# Patient Record
Sex: Male | Born: 1995 | Race: White | Hispanic: No | Marital: Single | Smoking: Never smoker
Health system: Southern US, Community
[De-identification: ages and names within clinical notes are randomized; demographics above are authoritative.]

## PROBLEM LIST (undated history)

## (undated) DIAGNOSIS — R42 Dizziness and giddiness: Secondary | ICD-10-CM

## (undated) DIAGNOSIS — M109 Gout, unspecified: Secondary | ICD-10-CM

---

## 2021-01-17 DIAGNOSIS — S60132A Contusion of left middle finger with damage to nail, initial encounter: Secondary | ICD-10-CM | POA: Diagnosis not present

## 2021-01-17 DIAGNOSIS — W230XXA Caught, crushed, jammed, or pinched between moving objects, initial encounter: Secondary | ICD-10-CM | POA: Insufficient documentation

## 2021-01-17 DIAGNOSIS — S6992XA Unspecified injury of left wrist, hand and finger(s), initial encounter: Secondary | ICD-10-CM | POA: Diagnosis present

## 2021-01-18 ENCOUNTER — Other Ambulatory Visit: Payer: Self-pay

## 2021-01-18 ENCOUNTER — Emergency Department (HOSPITAL_COMMUNITY)
Admission: EM | Admit: 2021-01-18 | Discharge: 2021-01-18 | Disposition: A | Discharge status: Home / Self Care | Payer: Worker's Compensation | Attending: Emergency Medicine | Admitting: Emergency Medicine

## 2021-01-18 ENCOUNTER — Emergency Department (HOSPITAL_COMMUNITY): Payer: Worker's Compensation

## 2021-01-18 ENCOUNTER — Encounter (HOSPITAL_COMMUNITY): Payer: Self-pay | Admitting: Emergency Medicine

## 2021-01-18 DIAGNOSIS — S6010XA Contusion of unspecified finger with damage to nail, initial encounter: Secondary | ICD-10-CM

## 2021-01-18 MED ORDER — LIDOCAINE HCL (PF) 1 % IJ SOLN
30.0000 mL | Freq: Once | INTRAMUSCULAR | Status: AC
  Administered 2021-01-18: 30 mL
  Filled 2021-01-18: qty 30

## 2021-01-18 NOTE — ED Provider Notes (Signed)
AP-EMERGENCY DEPT [facility] Emergency Department Provider Note MRN:  [id number]  Arrival date & time: 01/18/21     Chief Complaint   Hand Pain   History of Present Illness   Kevin Willis is a 25 y.o. year-old male with no part of past medical history presenting to the ED with chief complaint of hand pain.  Patient slammed finger into a door about 24 hours ago.  Having worsening pain and discoloration of the left middle finger nail and tip of the finger.  Denies any other injuries or complaints.  Pain is moderate to severe, constant.  Review of Systems  A complete 10 system review of systems was obtained and all systems are negative except as noted in the HPI and PMH.   Patient's Health History   History reviewed. No pertinent past medical history.  History reviewed. No pertinent surgical history.  No family history on file.  Social History   Socioeconomic History  . Marital status: Single    Spouse name: Not on file  . Number of children: Not on file  . Years of education: Not on file  . Highest education level: Not on file  Occupational History  . Not on file  Tobacco Use  . Smoking status: Never Smoker  . Smokeless tobacco: Never Used  Substance and Sexual Activity  . Alcohol use: Yes    Comment: occ  . Drug use: Never  . Sexual activity: Not on file  Other Topics Concern  . Not on file  Social History Narrative  . Not on file   Social Determinants of Health   Financial Resource Strain: Not on file  Food Insecurity: Not on file  Transportation Needs: Not on file  Physical Activity: Not on file  Stress: Not on file  Social Connections: Not on file  Intimate Partner Violence: Not on file     Physical Exam   Vitals:   01/18/21 0156  BP: (!) 143/97  Pulse: 88  Resp: 20  Temp: 98.3 F (36.8 C)  SpO2: 97%    CONSTITUTIONAL: Well-appearing, NAD NEURO:  Alert and oriented x 3, no focal deficits EYES:  eyes equal and reactive ENT/NECK:  no LAD,  no JVD CARDIO: Regular rate, well-perfused, normal S1 and S2 PULM:  CTAB no wheezing or rhonchi GI/GU:  normal bowel sounds, non-distended, non-tender MSK/SPINE:  No gross deformities, no edema SKIN: Subungual hematoma to the left middle fingernail, normal distal cap refill, tender to palpation PSYCH:  Appropriate speech and behavior  *Additional and/or pertinent findings included in MDM below  Diagnostic and Interventional Summary    EKG Interpretation  Date/Time:    Ventricular Rate:    PR Interval:    QRS Duration:   QT Interval:    QTC Calculation:   R Axis:     Text Interpretation:        Labs Reviewed - No data to display  DG Finger Middle Left  Final Result      Medications  lidocaine (PF) (XYLOCAINE) 1 % injection 30 mL (30 mLs Infiltration Given 01/18/21 0451)     Procedures  /  Critical Care .Nerve Block  Date/Time: 01/18/2021 5:11 AM Performed by: Sabas Sous, MD Authorized by: Sabas Sous, MD   Consent:    Consent obtained:  Verbal   Consent given by:  Patient   Risks, benefits, and alternatives were discussed: yes     Risks discussed:  Allergic reaction, bleeding, infection, intravenous injection, nerve damage, pain, swelling and  unsuccessful block Universal protocol:    Procedure explained and questions answered to patient or proxy's satisfaction: yes     Patient identity confirmed:  Verbally with patient Indications:    Indications:  Procedural anesthesia Location:    Nerve block body site: Finger block.   Laterality:  Left (Middle finger) Pre-procedure details:    Skin preparation:  Alcohol   Preparation: Patient was prepped and draped in usual sterile fashion   Procedure details:    Block needle gauge:  25 G   Anesthetic injected:  Lidocaine 1% w/o epi   Paresthesia:  None Post-procedure details:    Dressing:  None   Outcome:  Anesthesia achieved   Procedure completion:  Tolerated well, no immediate complications Aspiration of  blood/fluid  Date/Time: 01/18/2021 5:12 AM Performed by: Sabas Sous, MD Authorized by: Sabas Sous, MD  Consent: Verbal consent obtained. Risks and benefits: risks, benefits and alternatives were discussed Consent given by: patient Patient understanding: patient states understanding of the procedure being performed Preparation: Patient was prepped and draped in the usual sterile fashion. Patient tolerance: patient tolerated the procedure well with no immediate complications Comments: Cautery pen used to perform trephination of left middle finger subungual hematoma.  Successful with expression of subungual blood.     ED Course and Medical Decision Making  I have reviewed the triage vital signs, the nursing notes, and pertinent available records from the EMR.  Listed above are laboratory and imaging tests that I personally ordered, reviewed, and interpreted and then considered in my medical decision making (see below for details).  X-rays without fracture, pain is worsening and there is a large subungual hematoma evident on exam.  We discussed management options and decision was made to perform trephination procedure, which worked well and a fair amount of dark blood was expressed from under the nail.  Pain is improved, appropriate for discharge.       Elmer Sow. Pilar Plate, MD [facility] Emergency Medicine [facility] [email or web address]  Final Clinical Impressions(s) / ED Diagnoses     ICD-10-CM   1. Subungual hematoma of digit of hand, initial encounter  S60.10XA     ED Discharge Orders    None       Discharge Instructions Discussed with and Provided to Patient:     Discharge Instructions     You were evaluated in the Emergency Department and after careful evaluation, we did not find any emergent condition requiring admission or further testing in the hospital.  Your exam/testing today was overall reassuring.  X-ray did not show any broken bones.   Your pain seem to be due to a collection of blood underneath the nail, which we were able to drain here in the emergency department.  This should relieve the pressure and the pain.  There may be some drainage over the next few hours, this is normal.  Recommend Tylenol or Motrin at home for discomfort  Please return to the Emergency Department if you experience any worsening of your condition.  Thank you for allowing [place] to be a part of your care.        Sabas Sous, MD 01/18/21 [phone]

## 2021-01-18 NOTE — ED Notes (Signed)
Pt finger wrapped and tapped with gauze

## 2021-01-18 NOTE — Discharge Instructions (Addendum)
You were evaluated in the Emergency Department and after careful evaluation, we did not find any emergent condition requiring admission or further testing in the hospital.  Your exam/testing today was overall reassuring.  X-ray did not show any broken bones.  Your pain seem to be due to a collection of blood underneath the nail, which we were able to drain here in the emergency department.  This should relieve the pressure and the pain.  There may be some drainage over the next few hours, this is normal.  Recommend Tylenol or Motrin at home for discomfort  Please return to the Emergency Department if you experience any worsening of your condition.  Thank you for allowing [place] to be a part of your care.

## 2021-01-18 NOTE — ED Triage Notes (Signed)
Pt c/o left middle finger pain after slamming his finger in the door.

## 2021-01-28 ENCOUNTER — Ambulatory Visit (HOSPITAL_COMMUNITY)
Admission: RE | Admit: 2021-01-28 | Discharge: 2021-01-28 | Disposition: A | Discharge status: Home / Self Care | Payer: BC Managed Care – PPO | Source: Ambulatory Visit | Attending: Emergency Medicine | Admitting: Emergency Medicine

## 2021-01-28 ENCOUNTER — Encounter (HOSPITAL_COMMUNITY): Payer: Self-pay

## 2021-01-28 ENCOUNTER — Ambulatory Visit (HOSPITAL_COMMUNITY): Payer: Self-pay

## 2021-01-28 ENCOUNTER — Other Ambulatory Visit: Payer: Self-pay

## 2021-01-28 VITALS — BP 126/91 | HR 88 | Temp 97.5°F | Resp 20

## 2021-01-28 DIAGNOSIS — M654 Radial styloid tenosynovitis [de Quervain]: Secondary | ICD-10-CM | POA: Diagnosis not present

## 2021-01-28 DIAGNOSIS — M109 Gout, unspecified: Secondary | ICD-10-CM

## 2021-01-28 HISTORY — DX: Gout, unspecified: M10.9

## 2021-01-28 HISTORY — DX: Dizziness and giddiness: R42

## 2021-01-28 MED ORDER — IBUPROFEN 600 MG PO TABS: 600.0000 mg | ORAL_TABLET | Freq: Four times a day (QID) | ORAL | 0 refills | Status: DC | PRN

## 2021-01-28 MED ORDER — COLCHICINE 0.6 MG PO TABS: ORAL_TABLET | ORAL | 0 refills | Status: DC

## 2021-01-28 NOTE — ED Provider Notes (Signed)
HPI  SUBJECTIVE:  Kevin Willis is a 25 y.o. male who presents with 2 issues.  First he is requesting a refill on his colchicine.  States that he gets gout usually in the left foot.  Second, he is a right-handed male who reports atraumatic wrist pain starting last night while lifting a glass.  He states is located along his entire wrist.  Describes it as tight, stabbing.  Reports hypersensitivity.  No erythema, swelling, paresthesias, trauma to the wrist, change in his physical activity, numbness or tingling or limitation of motion of his fingers.  He states that the symptoms are getting better today but wanted to have it evaluated.  He tried ice with improvement in his symptoms.  Symptoms are worse with flexion/extension, radial deviation and picking objects up.  He states this does not feel like gout.  He has a past medical history of sprain left wrist.  No wrist fracture.  He has a history of gout vertigo.  No history of chronic kidney disease, peptic ulcer disease, GI bleed, carpal tunnel syndrome, diabetes, hypertension.  PMD: None.  He just moved here.  Past Medical History:  Diagnosis Date  . Gout   . Vertigo     History reviewed. No pertinent surgical history.  Family History  Family history unknown: Yes    Social History   Tobacco Use  . Smoking status: Never Smoker  . Smokeless tobacco: Never Used  Substance Use Topics  . Alcohol use: Yes    Comment: occ  . Drug use: Never    No current facility-administered medications for this encounter.  Current Outpatient Medications:  .  colchicine 0.6 MG tablet, 2 tabs po x 1, then one tab po 1 hour later, Disp: 6 tablet, Rfl: 0 .  ibuprofen (ADVIL) 600 MG tablet, Take 1 tablet (600 mg total) by mouth every 6 (six) hours as needed., Disp: 30 tablet, Rfl: 0  Allergies  Allergen Reactions  . Shellfish Allergy      ROS  As noted in HPI.   Physical Exam  BP (!) 126/91 (BP Location: Left Arm)   Pulse 88   Temp (!) 97.5 F  (36.4 C) (Oral)   Resp 20   SpO2 98%   Constitutional: Well developed, well nourished, no acute distress Eyes:  EOMI, conjunctiva normal bilaterally HENT: Normocephalic, atraumatic,mucus membranes moist Respiratory: Normal inspiratory effort Cardiovascular: Normal rate GI: nondistended skin: No rash, skin intact Musculoskeletal:  L distal radial styloid tenderness.  Distal radius NT, distal ulnar styloid NT, snuffbox NT, carpals NT , metacarpals NT , digits NT , TFCC NT.   no pain with supination,   no pain with pronation, pain with radial deviation.  No pain with ulnar deviation.  Motor intact ability to flex / extend digits of affected hand, Sensation LT to hand normal, RP 2+.   Finklestein Positive , Elbow and proximal forearm NT Neurologic: Alert & oriented x 3, no focal neuro deficits Psychiatric: Speech and behavior appropriate   ED Course   Medications - No data to display  Orders Placed This Encounter  Procedures  . Thumb spica    Standing Status:   Standing    Number of Occurrences:   1    Order Specific Question:   Laterality    Answer:   Left    No results found for this or any previous visit (from the past 24 hour(s)). No results found.  ED Clinical Impression  1. Tenosynovitis, de Quervain   2. Acute  gout of left foot, unspecified cause      ED Assessment/Plan  1.  Left wrist pain.  Appears to be de Quervain's tenosynovitis.  Will place in thumb spica, Tylenol/ibuprofen.  Follow-up with ortho hand if not better in 2 to 3 weeks with conservative therapy.  Dr. Roney Mans on call.  Deferred imaging in the absence of trauma.  Work note for today.  2.  Medication refill for gout.  Refilling colchicine  Discussed  MDM, treatment plan, and plan for follow-up with patient. . patient agrees with plan.   Meds ordered this encounter  Medications  . colchicine 0.6 MG tablet    Sig: 2 tabs po x 1, then one tab po 1 hour later    Dispense:  6 tablet    Refill:  0   . ibuprofen (ADVIL) 600 MG tablet    Sig: Take 1 tablet (600 mg total) by mouth every 6 (six) hours as needed.    Dispense:  30 tablet    Refill:  0    *This clinic note was created using Scientist, clinical (histocompatibility and immunogenetics). Therefore, there may be occasional mistakes despite careful proofreading.   ?    Domenick Gong, MD 01/28/21 [phone]

## 2021-01-28 NOTE — Discharge Instructions (Addendum)
Take 600 mg of ibuprofen combined with 1000 mg of Tylenol together 3 or 4 times a day as needed for pain.  Wear the wrist brace at all times for the first week, then as needed for comfort.  You may ice your wrist.  Below is a list of primary care practices who are taking new patients for you to follow-up with.  [facility] internal medicine clinic Ground Floor - [facility], [address] [place], [place], [place] [address] [phone]  [facility] Primary Care at [facility] [address] [place], [place] [address] [phone]  [facility] [address] [place], [place] [address] [phone]  [facility] [phone] [address] [place] [address]  [facility]: [address] [place] [address]  [phone]  [facility]: [address] [place] [address]   [phone]  [facility]: [address] [place] [address]  [phone]  [facility] : [address] [place] [address] [phone]  [facility] Primary Care: [address] [place] [address] [phone]  [facility]: [address] [place] [address] [phone]  Dr. Oneal Grout [address] [facility] [place] [address]  [phone]  Dr. Jackie Plum, [facility]. [address] [place], [place] [address]  [phone]  Go to www.goodrx.com to look up your medications. This will give you a list of where you can find your prescriptions at the most affordable prices. Or ask the pharmacist what the cash price is, or if they have any other discount programs available to help make your medication more affordable. This can be less expensive than what you  would pay with insurance.

## 2021-01-28 NOTE — ED Triage Notes (Signed)
Pt presents for medication refill (gout) and left wrist pain non injury related since yesterday.

## 2021-05-23 ENCOUNTER — Other Ambulatory Visit: Payer: Self-pay

## 2021-05-23 ENCOUNTER — Ambulatory Visit
Admission: EM | Admit: 2021-05-23 | Discharge: 2021-05-23 | Disposition: A | Discharge status: Home / Self Care | Payer: BC Managed Care – PPO | Attending: Family Medicine | Admitting: Family Medicine

## 2021-05-23 DIAGNOSIS — M79672 Pain in left foot: Secondary | ICD-10-CM

## 2021-05-23 MED ORDER — PREDNISONE 20 MG PO TABS: 40.0000 mg | ORAL_TABLET | Freq: Every day | ORAL | 0 refills | Status: DC

## 2021-05-23 MED ORDER — CYCLOBENZAPRINE HCL 10 MG PO TABS: 10.0000 mg | ORAL_TABLET | Freq: Three times a day (TID) | ORAL | 0 refills | Status: DC | PRN

## 2021-05-23 NOTE — ED Triage Notes (Signed)
Two weeks h/o pain in dorsal and plantar surface if his left foot that radiates to his anterior shin. Pt characterizes his pain as "with each step it feels like a nail is shooting through". Currently wearing compression socks and a brace with minimal relief. Also taking Ibuprofen with temporary relief. No falls or injuries. Denies right LE pain. Has a h/o gout.

## 2021-05-23 NOTE — ED Provider Notes (Signed)
[facility]    CSN: [id number] Arrival date & time: 05/23/21  0815      History   Chief Complaint Chief Complaint  Patient presents with   Foot Pain    left    HPI Kevin Willis is a 25 y.o. male.   Patient presenting today with about 2-week history of left foot pain, worse with movement and weightbearing.  States the pain sometimes radiates up the back of the ankle and the anterior shin.  Intermittent swelling, darkness to the skin but no skin lesions, fever, drainage, numbness, tingling, weakness and no known injury prior to onset of symptoms.  So far trying Dr. Margart Sickles inserts to his steel toe work boots with minimal benefit.  Ibuprofen resolves pain temporarily.  No past history of known chronic foot issues.   Past Medical History:  Diagnosis Date   Gout    Vertigo     There are no problems to display for this patient.   History reviewed. No pertinent surgical history.     Home Medications    Prior to Admission medications   Medication Sig Start Date End Date Taking? Authorizing Provider  cyclobenzaprine (FLEXERIL) 10 MG tablet Take 1 tablet (10 mg total) by mouth 3 (three) times daily as needed for muscle spasms. Do not drink alcohol or drive while taking this medication 05/23/21  Yes Particia Nearing, PA-C  predniSONE (DELTASONE) 20 MG tablet Take 2 tablets (40 mg total) by mouth daily with breakfast. 05/23/21  Yes Particia Nearing, PA-C  colchicine 0.6 MG tablet 2 tabs po x 1, then one tab po 1 hour later 01/28/21   Domenick Gong, MD  ibuprofen (ADVIL) 600 MG tablet Take 1 tablet (600 mg total) by mouth every 6 (six) hours as needed. 01/28/21   Domenick Gong, MD    Family History Family History  Family history unknown: Yes    Social History Social History   Tobacco Use   Smoking status: Never   Smokeless tobacco: Never  Substance Use Topics   Alcohol use: Yes    Comment: occ   Drug use: Never     Allergies   Shellfish  allergy   Review of Systems Review of Systems Per HPI  Physical Exam Triage Vital Signs ED Triage Vitals  Enc Vitals Group     BP 05/23/21 0902 (!) 149/83     Pulse Rate 05/23/21 0902 84     Resp 05/23/21 0902 18     Temp 05/23/21 0902 97.9 F (36.6 C)     Temp Source 05/23/21 0902 Oral     SpO2 05/23/21 0902 95 %     Weight --      Height --      Head Circumference --      Peak Flow --      Pain Score 05/23/21 0906 4     Pain Loc --      Pain Edu? --      Excl. in GC? --    No data found.  Updated Vital Signs BP (!) 149/83 (BP Location: Left Arm)   Pulse 84   Temp 97.9 F (36.6 C) (Oral)   Resp 18   SpO2 95%   Visual Acuity Right Eye Distance:   Left Eye Distance:   Bilateral Distance:    Right Eye Near:   Left Eye Near:    Bilateral Near:     Physical Exam Vitals and nursing note reviewed.  Constitutional:  Appearance: Normal appearance.  HENT:     Head: Atraumatic.  Eyes:     Extraocular Movements: Extraocular movements intact.     Conjunctiva/sclera: Conjunctivae normal.  Cardiovascular:     Rate and Rhythm: Normal rate and regular rhythm.     Pulses: Normal pulses.  Pulmonary:     Effort: Pulmonary effort is normal.     Breath sounds: Normal breath sounds.  Musculoskeletal:        General: Tenderness present. No swelling, deformity or signs of injury. Normal range of motion.     Cervical back: Normal range of motion and neck supple.     Comments: No obvious focal swelling left foot or ankle Diffuse tenderness to palpation bilateral Achilles, left lateral foot into ankle anteriorly.  Range of motion full and intact all 5 toes and left ankle.  Strength intact.  Normal gait.  Skin:    General: Skin is warm and dry.     Findings: No bruising or erythema.  Neurological:     General: No focal deficit present.     Mental Status: He is oriented to person, place, and time.     Comments: Left lower extremity neurovascularly intact  Psychiatric:         Mood and Affect: Mood normal.        Thought Content: Thought content normal.        Judgment: Judgment normal.   UC Treatments / Results  Labs (all labs ordered are listed, but only abnormal results are displayed) Labs Reviewed - No data to display  EKG   Radiology No results found.  Procedures Procedures (including critical care time)  Medications Ordered in UC Medications - No data to display  Initial Impression / Assessment and Plan / UC Course  I have reviewed the triage vital signs and the nursing notes.  Pertinent labs & imaging results that were available during my care of the patient were reviewed by me and considered in my medical decision making (see chart for details).     Suspect a tendinitis/overuse injury.  No evidence of a bony injury at this point, no deformity on palpation and no traumatic injury.  We will treat for inflammatory cause with prednisone, Flexeril, gentle stretches, rest, ice.  Work note given.  Follow-up with sports medicine if worsening or not resolving.  Final Clinical Impressions(s) / UC Diagnoses   Final diagnoses:  Left foot pain   Discharge Instructions   None    ED Prescriptions     Medication Sig Dispense Auth. Provider   predniSONE (DELTASONE) 20 MG tablet Take 2 tablets (40 mg total) by mouth daily with breakfast. 10 tablet Particia Nearing, PA-C   cyclobenzaprine (FLEXERIL) 10 MG tablet Take 1 tablet (10 mg total) by mouth 3 (three) times daily as needed for muscle spasms. Do not drink alcohol or drive while taking this medication 15 tablet Particia Nearing, [place]      PDMP not reviewed this encounter.   Kevin Willis [place], [place] 05/23/21 [phone]

## 2021-07-15 ENCOUNTER — Other Ambulatory Visit: Payer: Self-pay

## 2021-07-15 ENCOUNTER — Ambulatory Visit
Admission: RE | Admit: 2021-07-15 | Discharge: 2021-07-15 | Disposition: A | Discharge status: Home / Self Care | Payer: BC Managed Care – PPO | Source: Ambulatory Visit | Attending: Emergency Medicine | Admitting: Emergency Medicine

## 2021-07-15 VITALS — BP 118/84 | HR 103 | Temp 99.2°F | Resp 17

## 2021-07-15 DIAGNOSIS — M7541 Impingement syndrome of right shoulder: Secondary | ICD-10-CM | POA: Diagnosis not present

## 2021-07-15 DIAGNOSIS — M25511 Pain in right shoulder: Secondary | ICD-10-CM

## 2021-07-15 MED ORDER — NAPROXEN 500 MG PO TABS: 500.0000 mg | ORAL_TABLET | Freq: Two times a day (BID) | ORAL | 0 refills | Status: DC

## 2021-07-15 MED ORDER — TIZANIDINE HCL 2 MG PO TABS: 2.0000 mg | ORAL_TABLET | Freq: Four times a day (QID) | ORAL | 0 refills | Status: DC | PRN
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
## 2021-07-15 NOTE — Discharge Instructions (Addendum)
Wear sling only for comfort, gentle range of motion exercises for shoulder-see attached Naprosyn twice daily with food for pain Supplement with tizanidine at home/bedtime, this is a muscle laxer, may cause drowsiness, do not drive or work after taking Follow-up with sports medicine for persistent symptoms

## 2021-07-15 NOTE — ED Triage Notes (Signed)
Pt presents with shoulder pain x 4 weeks. States he feel discomfort when lifting it up.

## 2021-09-10 ENCOUNTER — Ambulatory Visit (INDEPENDENT_AMBULATORY_CARE_PROVIDER_SITE_OTHER): Payer: BC Managed Care – PPO

## 2021-09-10 ENCOUNTER — Other Ambulatory Visit: Payer: Self-pay

## 2021-09-10 ENCOUNTER — Ambulatory Visit
Admission: RE | Admit: 2021-09-10 | Discharge: 2021-09-10 | Disposition: A | Discharge status: Home / Self Care | Payer: BC Managed Care – PPO | Source: Ambulatory Visit | Attending: Urgent Care | Admitting: Urgent Care

## 2021-09-10 VITALS — BP 137/87 | HR 95 | Temp 98.1°F | Resp 16

## 2021-09-10 DIAGNOSIS — M25462 Effusion, left knee: Secondary | ICD-10-CM

## 2021-09-10 DIAGNOSIS — M25562 Pain in left knee: Secondary | ICD-10-CM | POA: Diagnosis not present

## 2021-09-10 MED ORDER — NAPROXEN 500 MG PO TABS: 500.0000 mg | ORAL_TABLET | Freq: Two times a day (BID) | ORAL | 0 refills | Status: DC

## 2021-09-10 NOTE — ED Triage Notes (Addendum)
Left knee pain starting last week while at work. States he sat down and felt a crackling sensation with mild pain in knee, ignored it. Over time the pain and steadily increased, now posterior and "feels like it's pulling down the back of my calf." Reports in 2021 fell 5 ft down stairs at work, urgent care at that time told him he had messed his knee up. Ht: 6'1"  Wt: 303 lb

## 2021-09-10 NOTE — ED Provider Notes (Signed)
[facility]   MRN: [id number] DOB: 11-16-1996  Subjective:   Kevin Willis is a 25 y.o. male presenting for 1 week history of persistent left knee pain.  Patient states that he was at work at the time, was sitting down when he tried to bend his leg and felt a loud pop.  States that since then it has become progressively worse including difficulty with bending at the level of his knee.  He is also having to walk with a limp.  Has a history of gout but reports that this is different.  No fall, trauma recently.  However he did fall and 2021 causing a left knee injury.  No current facility-administered medications for this encounter.  Current Outpatient Medications:  .  naproxen (NAPROSYN) 500 MG tablet, Take 1 tablet (500 mg total) by mouth 2 (two) times daily., Disp: 30 tablet, Rfl: 0 .  tiZANidine (ZANAFLEX) 2 MG tablet, Take 1-2 tablets (2-4 mg total) by mouth every 6 (six) hours as needed for muscle spasms., Disp: 30 tablet, Rfl: 0   Allergies  Allergen Reactions  . Shellfish Allergy     Past Medical History:  Diagnosis Date  . Gout   . Vertigo      History reviewed. No pertinent surgical history.  Family History  Family history unknown: Yes    Social History   Tobacco Use  . Smoking status: Never  . Smokeless tobacco: Never  Substance Use Topics  . Alcohol use: Yes    Comment: occ  . Drug use: Never    ROS   Objective:   Vitals: BP (!) 160/112 (BP Location: Right Arm)   Pulse 95   Temp 98.1 F (36.7 C) (Oral)   Resp 16   SpO2 95%   BP recheck was 137/87.   Physical Exam Constitutional:      General: He is not in acute distress.    Appearance: Normal appearance. He is well-developed and normal weight. He is not ill-appearing, toxic-appearing or diaphoretic.  HENT:     Head: Normocephalic and atraumatic.     Right Ear: External ear normal.     Left Ear: External ear normal.     Nose: Nose normal.     Mouth/Throat:     Pharynx:  Oropharynx is clear.  Eyes:     General: No scleral icterus.       Right eye: No discharge.        Left eye: No discharge.     Extraocular Movements: Extraocular movements intact.     Pupils: Pupils are equal, round, and reactive to light.  Cardiovascular:     Rate and Rhythm: Normal rate.  Pulmonary:     Effort: Pulmonary effort is normal.  Musculoskeletal:     Cervical back: Normal range of motion.     Left knee: Effusion and bony tenderness present. No swelling, deformity, erythema, ecchymosis, lacerations or crepitus. Decreased range of motion. Tenderness present over the medial joint line, lateral joint line and patellar tendon. Normal alignment and normal patellar mobility.  Skin:    General: Skin is warm and dry.  Neurological:     Mental Status: He is alert and oriented to person, place, and time.  Psychiatric:        Mood and Affect: Mood normal.        Behavior: Behavior normal.        Thought Content: Thought content normal.        Judgment: Judgment normal.  DG Knee Complete 4 Views Left  Result Date: 09/10/2021 CLINICAL DATA:  Left knee pain for 1 week after sitting down with crackling sensation EXAM: LEFT KNEE - COMPLETE 4+ VIEW COMPARISON:  None. FINDINGS: Probable small suprapatellar left knee joint effusion. No fracture or dislocation. No focal osseous lesions. No significant degenerative arthropathy. No radiopaque foreign bodies. IMPRESSION: Probable small suprapatellar left knee joint effusion. No fracture or malalignment. Electronically Signed   By: Delbert Phenix M.D.   On: 09/10/2021 14:06     Assessment and Plan :   PDMP not reviewed this encounter.  1. Acute pain of left knee   2. Effusion of left knee     Low suspicion for gout of the knee given physical exam findings.  Recommended conservative management with RICE method, naproxen for pain and inflammation.  Also advised that he pursue follow-up with an orthopedist for consideration of an MRI or  ultrasound to further outline whether or not there is a ligamentous or meniscus problem. Counseled patient on potential for adverse effects with medications prescribed/recommended today, ER and return-to-clinic precautions discussed, patient verbalized understanding.    Wallis Bamberg, [place] 09/10/21 1436

## 2021-11-22 ENCOUNTER — Ambulatory Visit
Admission: RE | Admit: 2021-11-22 | Discharge status: Against Medical Advice | Payer: BC Managed Care – PPO | Source: Ambulatory Visit

## 2021-11-24 ENCOUNTER — Other Ambulatory Visit: Payer: Self-pay

## 2021-11-24 ENCOUNTER — Ambulatory Visit
Admission: RE | Admit: 2021-11-24 | Discharge: 2021-11-24 | Disposition: A | Discharge status: Home / Self Care | Payer: BC Managed Care – PPO | Source: Ambulatory Visit

## 2021-11-24 VITALS — BP 116/77 | HR 82 | Temp 98.0°F | Resp 18

## 2021-11-24 DIAGNOSIS — M5442 Lumbago with sciatica, left side: Secondary | ICD-10-CM

## 2021-11-24 MED ORDER — PREDNISONE 20 MG PO TABS: 40.0000 mg | ORAL_TABLET | Freq: Every day | ORAL | 0 refills | Status: AC

## 2021-11-24 MED ORDER — CYCLOBENZAPRINE HCL 10 MG PO TABS: 10.0000 mg | ORAL_TABLET | Freq: Two times a day (BID) | ORAL | 0 refills | Status: DC | PRN

## 2021-11-24 NOTE — ED Provider Notes (Signed)
Kevin Willis    CSN: [id number] Arrival date & time: 11/24/21  0850      History   Chief Complaint Chief Complaint  Patient presents with   Back Pain    lumbar   9a appointment    HPI Kevin Willis is a 25 y.o. male.   Patient here today for evaluation of low back pain that is been intermittent over the last 2 years since an accident at work.  He reports that over the last week he has had worsening low back pain that will radiate down his left buttocks and into his left thigh.  Pain stops at the knee.  He reports the pain is stabbing in nature.  He does report some issues with pain in his leg creating difficulty walking at times.  He does not report any numbness or tingling.  Previously he was on [employer]. for symptoms however they recommended he return to work.  The history is provided by the patient.  Back Pain Associated symptoms: no fever and no numbness    Past Medical History:  Diagnosis Date   Gout    Vertigo     There are no problems to display for this patient.   History reviewed. No pertinent surgical history.     Home Medications    Prior to Admission medications   Medication Sig Start Date End Date Taking? Authorizing Provider  colchicine 0.6 MG tablet Take by mouth. 12/02/20  Yes [provider]  cyclobenzaprine (FLEXERIL) 10 MG tablet Take 1 tablet (10 mg total) by mouth 2 (two) times daily as needed for muscle spasms. 11/24/21  Yes Tomi Bamberger, PA-C  meclizine (ANTIVERT) 25 MG tablet Take 1 tablet by mouth 3 (three) times daily as needed. 03/27/20  Yes [provider]  predniSONE (DELTASONE) 20 MG tablet Take 2 tablets (40 mg total) by mouth daily with breakfast for 5 days. 11/24/21 11/29/21 Yes Tomi Bamberger, PA-C  naproxen (NAPROSYN) 500 MG tablet Take 1 tablet (500 mg total) by mouth 2 (two) times daily with a meal. 09/10/21   Wallis Bamberg, PA-C    Family History Family History  Family history unknown: Yes     Social History Social History   Tobacco Use   Smoking status: Never   Smokeless tobacco: Never  Substance Use Topics   Alcohol use: Yes    Comment: occ   Drug use: Never     Allergies   Shellfish allergy   Review of Systems Review of Systems  Constitutional:  Negative for chills and fever.  Eyes:  Negative for discharge and redness.  Respiratory:  Negative for shortness of breath.   Musculoskeletal:  Positive for back pain and myalgias.  Neurological:  Negative for numbness.    Physical Exam Triage Vital Signs ED Triage Vitals  Enc Vitals Group     BP 11/24/21 0912 116/77     Pulse Rate 11/24/21 0912 82     Resp 11/24/21 0912 18     Temp 11/24/21 0912 98 F (36.7 C)     Temp Source 11/24/21 0912 Oral     SpO2 11/24/21 0912 97 %     Weight --      Height --      Head Circumference --      Peak Flow --      Pain Score 11/24/21 0915 0     Pain Loc --      Pain Edu? --  Excl. in GC? --    No data found.  Updated Vital Signs BP 116/77 (BP Location: Right Arm)   Pulse 82   Temp 98 F (36.7 C) (Oral)   Resp 18   SpO2 97%      Physical Exam Vitals and nursing note reviewed.  Constitutional:      General: He is not in acute distress.    Appearance: Normal appearance. He is not ill-appearing.  HENT:     Head: Normocephalic and atraumatic.  Musculoskeletal:     Comments: No TTP noted to midline spine or across low back diffusely  Neurological:     Mental Status: He is alert.  Psychiatric:        Mood and Affect: Mood normal.        Behavior: Behavior normal.     UC Treatments / Results  Labs (all labs ordered are listed, but only abnormal results are displayed) Labs Reviewed - No data to display  EKG   Radiology No results found.  Procedures Procedures (including critical Willis time)  Medications Ordered in UC Medications - No data to display  Initial Impression / Assessment and Plan / UC Course  I have reviewed the triage  vital signs and the nursing notes.  Pertinent labs & imaging results that were available during my Willis of the patient were reviewed by me and considered in my medical decision making (see chart for details).  Suspect likely sciatica and will treat with steroid burst as well as muscle relaxer.  Recommended follow-up with PCP as he may be a good candidate for physical therapy given prolonged symptoms.  Patient expresses understanding.   Final Clinical Impressions(s) / UC Diagnoses   Final diagnoses:  Acute left-sided low back pain with left-sided sciatica     Discharge Instructions      Please follow up with PCP.      ED Prescriptions     Medication Sig Dispense Auth. Provider   predniSONE (DELTASONE) 20 MG tablet Take 2 tablets (40 mg total) by mouth daily with breakfast for 5 days. 10 tablet Erma Pinto F, PA-C   cyclobenzaprine (FLEXERIL) 10 MG tablet Take 1 tablet (10 mg total) by mouth 2 (two) times daily as needed for muscle spasms. 20 tablet Tomi Bamberger, PA-C      PDMP not reviewed this encounter.   Tomi Bamberger, PA-C 11/24/21 1439

## 2021-11-24 NOTE — ED Triage Notes (Signed)
One week h/o low back pain. Has been taking advil and leftover prescribed muscle relaxers with some relief. Pain is intermittent and radiates into his left buttock before extending into the posterior aspect of his left thigh and stopping at the knee. Pt describes the pain as 'massive stabbing".  Pt reports two years ago, he had an accident at work that caused him to have LBP.

## 2021-11-24 NOTE — Discharge Instructions (Signed)
Please follow up with PCP.

## 2022-02-27 ENCOUNTER — Other Ambulatory Visit: Payer: Self-pay

## 2022-02-27 ENCOUNTER — Ambulatory Visit
Admission: RE | Admit: 2022-02-27 | Discharge: 2022-02-27 | Disposition: A | Discharge status: Home / Self Care | Payer: 59 | Source: Ambulatory Visit | Attending: Internal Medicine | Admitting: Internal Medicine

## 2022-02-27 ENCOUNTER — Ambulatory Visit (INDEPENDENT_AMBULATORY_CARE_PROVIDER_SITE_OTHER): Payer: 59

## 2022-02-27 VITALS — BP 136/91 | HR 103 | Temp 98.2°F | Resp 20

## 2022-02-27 DIAGNOSIS — M25532 Pain in left wrist: Secondary | ICD-10-CM | POA: Diagnosis not present

## 2022-02-27 DIAGNOSIS — J069 Acute upper respiratory infection, unspecified: Secondary | ICD-10-CM | POA: Diagnosis not present

## 2022-02-27 MED ORDER — BENZONATATE 100 MG PO CAPS: 100.0000 mg | ORAL_CAPSULE | Freq: Three times a day (TID) | ORAL | 0 refills | Status: AC | PRN

## 2022-02-27 MED ORDER — FLUTICASONE PROPIONATE 50 MCG/ACT NA SUSP: 1.0000 | Freq: Every day | NASAL | 0 refills | Status: AC

## 2022-02-27 NOTE — Discharge Instructions (Signed)
It appears that you have a viral upper respiratory infection that should self resolve in the next few days with symptomatic treatment.  You have been prescribed 2 medications to alleviate symptoms.  Your wrist x-ray was negative for any acute bony abnormality.  Wrist brace has been applied.  Follow-up with orthopedist if pain persists or worsens. ?

## 2022-02-27 NOTE — ED Triage Notes (Signed)
Pt said x 3 days has been having cough, congestion, fatigue and dizziness but does have vertigo and kicked in last night. Pt took a home test last night and is negative for Covid. Pt does have severe allergies. Has not  taken anything OTC bc he is afraid it will make him drowsy. 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 ?

## 2022-02-27 NOTE — ED Provider Notes (Signed)
?[facility] ? ? ? ?CSN: [id number] ?Arrival date & time: 02/27/22  1542 ? ? ?  ? ?History   ?Chief Complaint ?Chief Complaint  ?Patient presents with  ? Cough  ? Wrist Pain  ? ? ?HPI ?Kevin Willis is a 26 y.o. male.  ? ?Patient presents with cough, nasal congestion, fatigue, dizziness that has been present for approximately 3 days.  Tmax at home was 101.  Denies any known sick contacts.  Denies chest pain, shortness of breath, sore throat, ear pain, nausea, vomiting, diarrhea, abdominal pain.  Patient has taken Tylenol for symptoms.  Patient also reports some left wrist pain that started a few days prior.  Denies any apparent injury but patient reports that he does a lot of lifting at work.  Denies any numbness or tingling.  Patient has applied a wrist brace and taken Tylenol with minimal improvement. ? ? ?Cough ?Wrist Pain ? ? ?Past Medical History:  ?Diagnosis Date  ? Gout   ? Vertigo   ? ? ?There are no problems to display for this patient. ? ? ?History reviewed. No pertinent surgical history. ? ? ? ? ?Home Medications   ? ?Prior to Admission medications   ?Medication Sig Start Date End Date Taking? Authorizing Provider  ?benzonatate (TESSALON) 100 MG capsule Take 1 capsule (100 mg total) by mouth every 8 (eight) hours as needed for cough. 02/27/22  Yes Gustavus Bryant, FNP  ?fluticasone (FLONASE) 50 MCG/ACT nasal spray Place 1 spray into both nostrils daily for 3 days. 02/27/22 03/02/22 Yes Gustavus Bryant, FNP  ?colchicine 0.6 MG tablet Take by mouth. 12/02/20   [provider]  ?cyclobenzaprine (FLEXERIL) 10 MG tablet Take 1 tablet (10 mg total) by mouth 2 (two) times daily as needed for muscle spasms. 11/24/21   Tomi Bamberger, PA-C  ?meclizine (ANTIVERT) 25 MG tablet Take 1 tablet by mouth 3 (three) times daily as needed. 03/27/20   [provider]  ?naproxen (NAPROSYN) 500 MG tablet Take 1 tablet (500 mg total) by mouth 2 (two) times daily with a meal. 09/10/21   Wallis Bamberg, PA-C   ? ? ?Family History ?Family History  ?Family history unknown: Yes  ? ? ?Social History ?Social History  ? ?Tobacco Use  ? Smoking status: Never  ? Smokeless tobacco: Never  ?Substance Use Topics  ? Alcohol use: Yes  ?  Comment: occ  ? Drug use: Never  ? ? ? ?Allergies   ?Shellfish allergy ? ? ?Review of Systems ?Review of Systems ?Per HPI ? ?Physical Exam ?Triage Vital Signs ?ED Triage Vitals  ?Enc Vitals Group  ?   BP 02/27/22 1625 (!) 136/91  ?   Pulse Rate 02/27/22 1625 (!) 103  ?   Resp 02/27/22 1625 20  ?   Temp 02/27/22 1625 98.2 ?F (36.8 ?C)  ?   Temp Source 02/27/22 1625 Oral  ?   SpO2 02/27/22 1625 94 %  ?   Weight --   ?   Height --   ?   Head Circumference --   ?   Peak Flow --   ?   Pain Score 02/27/22 1629 5  ?   Pain Loc --   ?   Pain Edu? --   ?   Excl. in GC? --   ? ?No data found. ? ?Updated Vital Signs ?BP (!) 136/91 (BP Location: Left Arm)   Pulse (!) 103   Temp 98.2 ?F (36.8 ?C) (Oral)  Resp 20   SpO2 94%  ? ?Visual Acuity ?Right Eye Distance:   ?Left Eye Distance:   ?Bilateral Distance:   ? ?Right Eye Near:   ?Left Eye Near:    ?Bilateral Near:    ? ?Physical Exam ?Constitutional:   ?   General: He is not in acute distress. ?   Appearance: Normal appearance. He is not toxic-appearing or diaphoretic.  ?HENT:  ?   Head: Normocephalic and atraumatic.  ?   Right Ear: Tympanic membrane and ear canal normal.  ?   Left Ear: Tympanic membrane and ear canal normal.  ?   Nose: Congestion present.  ?   Mouth/Throat:  ?   Mouth: Mucous membranes are moist.  ?   Pharynx: No posterior oropharyngeal erythema.  ?Eyes:  ?   Extraocular Movements: Extraocular movements intact.  ?   Conjunctiva/sclera: Conjunctivae normal.  ?   Pupils: Pupils are equal, round, and reactive to light.  ?Cardiovascular:  ?   Rate and Rhythm: Normal rate and regular rhythm.  ?   Pulses: Normal pulses.  ?   Heart sounds: Normal heart sounds.  ?Pulmonary:  ?   Effort: Pulmonary effort is normal. No respiratory distress.  ?    Breath sounds: Normal breath sounds.  ?Abdominal:  ?   General: Bowel sounds are normal. There is no distension.  ?   Palpations: Abdomen is soft.  ?   Tenderness: There is no abdominal tenderness.  ?Musculoskeletal:  ?   Comments: Generalized pain to palpation to left wrist.  No pain to palpation of hand.  Grip strength 5/5.  Neurovascular intact.  Full range of motion of wrist.  ?Skin: ?   General: Skin is warm and dry.  ?Neurological:  ?   General: No focal deficit present.  ?   Mental Status: He is alert and oriented to person, place, and time. Mental status is at baseline.  ?Psychiatric:     ?   Mood and Affect: Mood normal.     ?   Behavior: Behavior normal.     ?   Thought Content: Thought content normal.     ?   Judgment: Judgment normal.  ? ? ? ?UC Treatments / Results  ?Labs ?(all labs ordered are listed, but only abnormal results are displayed) ?Labs Reviewed  ?NOVEL CORONAVIRUS, NAA  ? ? ?EKG ? ? ?Radiology ?DG Wrist Complete Left ? ?Result Date: 02/27/2022 ?CLINICAL DATA:  left wrist pain EXAM: LEFT WRIST - COMPLETE 3+ VIEW COMPARISON:  None. FINDINGS: There is no evidence of fracture or dislocation. There is no evidence of arthropathy or other focal bone abnormality. Soft tissues are unremarkable. IMPRESSION: Negative. Electronically Signed   By: Malachy Moan M.D.   On: 02/27/2022 16:53   ? ?Procedures ?Procedures (including critical care time) ? ?Medications Ordered in UC ?Medications - No data to display ? ?Initial Impression / Assessment and Plan / UC Course  ?I have reviewed the triage vital signs and the nursing notes. ? ?Pertinent labs & imaging results that were available during my care of the patient were reviewed by me and considered in my medical decision making (see chart for details). ? ?  ? ?Patient presents with symptoms likely from a viral upper respiratory infection. Differential includes bacterial pneumonia, sinusitis, allergic rhinitis, COVID-19, flu, RSV. Do not suspect  underlying cardiopulmonary process. Symptoms seem unlikely related to ACS, CHF or COPD exacerbations, pneumonia, pneumothorax. Patient is nontoxic appearing and not in need of emergent medical  intervention.  COVID-19 test pending.Recommended symptom control with over the counter medications.  Patient sent prescriptions. ? ?Left wrist x-ray was negative for any acute bony abnormality.  Suspect muscular injury/strain versus carpal tunnel syndrome.  Wrist brace applied in urgent care.  Discussed supportive care and ice application.  Patient to follow-up with provided contact information for orthopedist if pain persists or worsens. ? ?Return if symptoms fail to improve in 1-2 weeks or you develop shortness of breath, chest pain, severe headache. Patient states understanding and is agreeable. ? ?Discharged with PCP followup.  ?Final Clinical Impressions(s) / UC Diagnoses  ? ?Final diagnoses:  ?Viral upper respiratory tract infection with cough  ?Left wrist pain  ? ? ? ?Discharge Instructions   ? ?  ?It appears that you have a viral upper respiratory infection that should self resolve in the next few days with symptomatic treatment.  You have been prescribed 2 medications to alleviate symptoms.  Your wrist x-ray was negative for any acute bony abnormality.  Wrist brace has been applied.  Follow-up with orthopedist if pain persists or worsens. ? ? ? ?ED Prescriptions   ? ? Medication Sig Dispense Auth. Provider  ? fluticasone (FLONASE) 50 MCG/ACT nasal spray Place 1 spray into both nostrils daily for 3 days. 16 g Ervin KnackMound, Shalva Rozycki E, [place]  ? benzonatate (TESSALON) 100 MG capsule Take 1 capsule (100 mg total) by mouth every 8 (eight) hours as needed for cough. 21 capsule Ervin KnackMound, Errik Mitchelle E, [place]  ? ?  ? ?PDMP not reviewed this encounter. ?  ?Gustavus BryantMound, Kalene Cutler E, [place]FNP ?02/27/22 1714 ? ?

## 2022-02-28 LAB — NOVEL CORONAVIRUS, NAA: SARS-CoV-2, NAA: NOT DETECTED

## 2022-07-24 ENCOUNTER — Emergency Department (HOSPITAL_BASED_OUTPATIENT_CLINIC_OR_DEPARTMENT_OTHER)
Admission: EM | Admit: 2022-07-24 | Discharge: 2022-07-24 | Disposition: A | Discharge status: Home / Self Care | Payer: 59 | Attending: Emergency Medicine | Admitting: Emergency Medicine

## 2022-07-24 ENCOUNTER — Other Ambulatory Visit: Payer: Self-pay

## 2022-07-24 ENCOUNTER — Ambulatory Visit: Admission: RE | Admit: 2022-07-24 | Discharge: 2022-07-24 | Payer: 59 | Source: Ambulatory Visit

## 2022-07-24 ENCOUNTER — Encounter (HOSPITAL_BASED_OUTPATIENT_CLINIC_OR_DEPARTMENT_OTHER): Payer: Self-pay

## 2022-07-24 ENCOUNTER — Emergency Department (HOSPITAL_BASED_OUTPATIENT_CLINIC_OR_DEPARTMENT_OTHER): Payer: 59

## 2022-07-24 VITALS — BP 135/89 | HR 86 | Temp 98.7°F | Resp 18

## 2022-07-24 DIAGNOSIS — R42 Dizziness and giddiness: Secondary | ICD-10-CM | POA: Insufficient documentation

## 2022-07-24 DIAGNOSIS — R110 Nausea: Secondary | ICD-10-CM | POA: Diagnosis not present

## 2022-07-24 LAB — URINALYSIS, ROUTINE W REFLEX MICROSCOPIC
Bilirubin Urine: NEGATIVE
Glucose, UA: NEGATIVE mg/dL
Hgb urine dipstick: NEGATIVE
Ketones, ur: NEGATIVE mg/dL
Leukocytes,Ua: NEGATIVE
Nitrite: NEGATIVE
Protein, ur: NEGATIVE mg/dL
Specific Gravity, Urine: 1.014 (ref 1.005–1.030)
pH: 6 (ref 5.0–8.0)

## 2022-07-24 LAB — BASIC METABOLIC PANEL
Anion gap: 12 (ref 5–15)
BUN: 14 mg/dL (ref 6–20)
CO2: 25 mmol/L (ref 22–32)
Calcium: 10.2 mg/dL (ref 8.9–10.3)
Chloride: 104 mmol/L (ref 98–111)
Creatinine, Ser: 1.11 mg/dL (ref 0.61–1.24)
GFR, Estimated: >60 mL/min (ref >60)
Glucose, Bld: 100 mg/dL — ABNORMAL HIGH (ref 70–99)
Potassium: 4 mmol/L (ref 3.5–5.1)
Sodium: 141 mmol/L (ref 135–145)

## 2022-07-24 LAB — CBC
HCT: 44.3 % (ref 39.0–52.0)
Hemoglobin: 15.2 g/dL (ref 13.0–17.0)
MCH: 29 pg (ref 26.0–34.0)
MCHC: 34.3 g/dL (ref 30.0–36.0)
MCV: 84.4 fL (ref 80.0–100.0)
Platelets: 361 10*3/uL (ref 150–400)
RBC: 5.25 MIL/uL (ref 4.22–5.81)
RDW: 11.9 % (ref 11.5–15.5)
WBC: 12.3 10*3/uL — ABNORMAL HIGH (ref 4.0–10.5)
nRBC: 0 % (ref 0.0–0.2)

## 2022-07-24 LAB — CBG MONITORING, ED: Glucose-Capillary: 95 mg/dL (ref 70–99)

## 2022-07-24 MED ORDER — ONDANSETRON HCL 4 MG PO TABS: 4.0000 mg | ORAL_TABLET | Freq: Four times a day (QID) | ORAL | 0 refills | Status: AC

## 2022-07-24 MED ORDER — MECLIZINE HCL 25 MG PO TABS: 25.0000 mg | ORAL_TABLET | Freq: Three times a day (TID) | ORAL | 0 refills | Status: AC | PRN

## 2022-07-24 NOTE — ED Notes (Signed)
Pt d/c home per MD order. Discharge summary reviewed with pt, pt verbalizes understanding. Ambulatory off unit. No s/s of acute distress noted.  

## 2022-07-24 NOTE — Discharge Instructions (Signed)
  [facility]:  [address] [place], [place] [address]

## 2022-07-24 NOTE — ED Provider Notes (Signed)
[facility]    CSN: [id number] Arrival date & time: 07/24/22  1239      History   Chief Complaint Chief Complaint  Patient presents with   Dizziness    Entered by patient    HPI Kevin Willis is a 26 y.o. male.   Patient here today for evaluation of intermittent dizziness over the last week.  He reports that he does have history of vertigo but states his current symptoms seem to be worse.  He notes that he has had headaches as well as blurry vision.  He was prescribed medication for vertigo in the past but states he does not tolerate due to drowsiness.  The history is provided by the patient.  Dizziness Associated symptoms: headaches and nausea   Associated symptoms: no vomiting     Past Medical History:  Diagnosis Date   Gout    Vertigo     There are no problems to display for this patient.   History reviewed. No pertinent surgical history.     Home Medications    Prior to Admission medications   Medication Sig Start Date End Date Taking? Authorizing Provider  benzonatate (TESSALON) 100 MG capsule Take 1 capsule (100 mg total) by mouth every 8 (eight) hours as needed for cough. 02/27/22   Gustavus Bryant, FNP  colchicine 0.6 MG tablet Take by mouth. 12/02/20   [provider]  cyclobenzaprine (FLEXERIL) 10 MG tablet Take 1 tablet (10 mg total) by mouth 2 (two) times daily as needed for muscle spasms. 11/24/21   Tomi Bamberger, PA-C  fluticasone (FLONASE) 50 MCG/ACT nasal spray Place 1 spray into both nostrils daily for 3 days. 02/27/22 03/02/22  Gustavus Bryant, FNP  meclizine (ANTIVERT) 25 MG tablet Take 1 tablet by mouth 3 (three) times daily as needed. 03/27/20   [provider]  naproxen (NAPROSYN) 500 MG tablet Take 1 tablet (500 mg total) by mouth 2 (two) times daily with a meal. 09/10/21   Wallis Bamberg, PA-C    Family History Family History  Family history unknown: Yes    Social History Social History   Tobacco Use    Smoking status: Never   Smokeless tobacco: Never  Substance Use Topics   Alcohol use: Yes    Comment: occ   Drug use: Never     Allergies   Shellfish allergy   Review of Systems Review of Systems  Constitutional:  Negative for chills and fever.  Eyes:  Positive for visual disturbance. Negative for discharge and redness.  Gastrointestinal:  Positive for nausea. Negative for vomiting.  Neurological:  Positive for dizziness and headaches. Negative for numbness.     Physical Exam Triage Vital Signs ED Triage Vitals  Enc Vitals Group     BP 07/24/22 1302 135/89     Pulse Rate 07/24/22 1302 86     Resp 07/24/22 1302 18     Temp 07/24/22 1302 98.7 F (37.1 C)     Temp Source 07/24/22 1302 Oral     SpO2 07/24/22 1302 97 %     Weight --      Height --      Head Circumference --      Peak Flow --      Pain Score 07/24/22 1304 0     Pain Loc --      Pain Edu? --      Excl. in GC? --    No data found.  Updated Vital Signs BP  135/89 (BP Location: Left Arm)   Pulse 86   Temp 98.7 F (37.1 C) (Oral)   Resp 18   SpO2 97%      Physical Exam Vitals and nursing note reviewed.  Constitutional:      General: He is not in acute distress.    Appearance: Normal appearance. He is not ill-appearing.  HENT:     Head: Normocephalic and atraumatic.  Eyes:     Conjunctiva/sclera: Conjunctivae normal.  Cardiovascular:     Rate and Rhythm: Normal rate.  Pulmonary:     Effort: Pulmonary effort is normal.  Neurological:     Mental Status: He is alert.  Psychiatric:        Mood and Affect: Mood normal.        Behavior: Behavior normal.        Thought Content: Thought content normal.      UC Treatments / Results  Labs (all labs ordered are listed, but only abnormal results are displayed) Labs Reviewed - No data to display  EKG   Radiology No results found.  Procedures Procedures (including critical care time)  Medications Ordered in UC Medications - No data to  display  Initial Impression / Assessment and Plan / UC Course  I have reviewed the triage vital signs and the nursing notes.  Pertinent labs & imaging results that were available during my care of the patient were reviewed by me and considered in my medical decision making (see chart for details).  Recommended further evaluation in the emergency department given reported vision changes and significant headaches.  Final Clinical Impressions(s) / UC Diagnoses   Final diagnoses:  Dizziness     Discharge Instructions       [facility]:  [address] [place], [place] [address]     ED Prescriptions   None    PDMP not reviewed this encounter.   Tomi Bamberger, PA-C 07/24/22 1350

## 2022-07-24 NOTE — ED Triage Notes (Signed)
Patient here POV from Home.  Endorses Dizziness approximately 1 Week. Associated with Nausea, Headache, and Blurry Vision.  History of Untreated Vertigo. No Fevers. 1 Day of Emesis. Seen at [facility] today and Sent for Evaluation.   NAD Noted during Triage. A&Ox4. GCS 15. Ambulatory.

## 2022-07-24 NOTE — ED Triage Notes (Signed)
Pt presents with intermittent dizziness with some slight nausea X 1 week; pt states he has medication for dizziness but does not take it because he does not like the side effects.

## 2022-07-24 NOTE — ED Provider Notes (Signed)
[facility] EMERGENCY DEPT Provider Note   CSN: [id number] Arrival date & time: 07/24/22  1408     History  Chief Complaint  Patient presents with   Dizziness    Kevin Willis is a 26 y.o. male.  Patient here with intermittent dizziness and vertigo symptoms.  History of the same.  Worse when he changes positions.  He is asymptomatic now.  He is able to walk around without any issues.  He was having some nausea with it as well.  It is making it hard to work.  He has to work Quarry manager.  He has no headache.  His symptoms are mostly resolved at this time.  No chest pain, shortness of breath, weakness, numbness, vision loss  The history is provided by the patient.       Home Medications Prior to Admission medications   Medication Sig Start Date End Date Taking? Authorizing Provider  benzonatate (TESSALON) 100 MG capsule Take 1 capsule (100 mg total) by mouth every 8 (eight) hours as needed for cough. 02/27/22   Gustavus Bryant, FNP  colchicine 0.6 MG tablet Take by mouth. 12/02/20   [provider]  cyclobenzaprine (FLEXERIL) 10 MG tablet Take 1 tablet (10 mg total) by mouth 2 (two) times daily as needed for muscle spasms. 11/24/21   Tomi Bamberger, PA-C  fluticasone (FLONASE) 50 MCG/ACT nasal spray Place 1 spray into both nostrils daily for 3 days. 02/27/22 03/02/22  Gustavus Bryant, FNP  meclizine (ANTIVERT) 25 MG tablet Take 1 tablet by mouth 3 (three) times daily as needed. 03/27/20   [provider]  naproxen (NAPROSYN) 500 MG tablet Take 1 tablet (500 mg total) by mouth 2 (two) times daily with a meal. 09/10/21   Wallis Bamberg, PA-C      Allergies    Shellfish allergy    Review of Systems   Review of Systems  Physical Exam Updated Vital Signs BP (!) 138/97 (BP Location: Right Arm)   Pulse 94   Temp 98.3 F (36.8 C) (Oral)   Resp 16   Ht 6\' 2"  (1.88 m)   Wt (!) 146.1 kg   SpO2 98%   BMI 41.35 kg/m  Physical Exam Vitals and nursing note  reviewed.  Constitutional:      General: He is not in acute distress.    Appearance: He is well-developed. He is not ill-appearing.  HENT:     Head: Normocephalic and atraumatic.     Nose: Nose normal.     Mouth/Throat:     Mouth: Mucous membranes are moist.  Eyes:     Extraocular Movements: Extraocular movements intact.     Conjunctiva/sclera: Conjunctivae normal.     Pupils: Pupils are equal, round, and reactive to light.  Cardiovascular:     Rate and Rhythm: Normal rate and regular rhythm.     Pulses: Normal pulses.     Heart sounds: Normal heart sounds. No murmur heard. Pulmonary:     Effort: Pulmonary effort is normal. No respiratory distress.     Breath sounds: Normal breath sounds.  Abdominal:     Palpations: Abdomen is soft.     Tenderness: There is no abdominal tenderness.  Musculoskeletal:        General: No swelling.     Cervical back: Normal range of motion and neck supple.  Skin:    General: Skin is warm and dry.     Capillary Refill: Capillary refill takes less than 2 seconds.  Neurological:  General: No focal deficit present.     Mental Status: He is alert and oriented to person, place, and time.     Cranial Nerves: No cranial nerve deficit.     Sensory: No sensory deficit.     Motor: No weakness.     Coordination: Coordination normal.     Gait: Gait normal.     Comments: 5+ out of 5 strength throughout, normal sensation, no drift, normal finger-nose-finger, normal speech  Psychiatric:        Mood and Affect: Mood normal.     ED Results / Procedures / Treatments   Labs (all labs ordered are listed, but only abnormal results are displayed) Labs Reviewed  BASIC METABOLIC PANEL - Abnormal; Notable for the following components:      Result Value   Glucose, Bld 100 (*)    All other components within normal limits  CBC - Abnormal; Notable for the following components:   WBC 12.3 (*)    All other components within normal limits  URINALYSIS, ROUTINE W  REFLEX MICROSCOPIC  CBG MONITORING, ED    EKG EKG Interpretation  Date/Time:  Thursday July 24 2022 14:15:33 EDT Ventricular Rate:  94 PR Interval:  140 QRS Duration: 82 QT Interval:  340 QTC Calculation: 425 R Axis:   55 Text Interpretation: Normal sinus rhythm Normal ECG No previous ECGs available Confirmed by Virgina Norfolk ([id number]) on 07/24/2022 3:28:14 PM  Radiology CT Head Wo Contrast  Result Date: 07/24/2022 CLINICAL DATA:  Headache as well as dizziness for 1 week. EXAM: CT HEAD WITHOUT CONTRAST TECHNIQUE: Contiguous axial images were obtained from the base of the skull through the vertex without intravenous contrast. RADIATION DOSE REDUCTION: This exam was performed according to the departmental dose-optimization program which includes automated exposure control, adjustment of the mA and/or kV according to patient size and/or use of iterative reconstruction technique. COMPARISON:  None Available. FINDINGS: Brain: No evidence of acute infarction, hemorrhage, hydrocephalus, extra-axial collection or mass lesion/mass effect. Vascular: No hyperdense vessel or unexpected calcification. Skull: Normal. Negative for fracture or focal lesion. Sinuses/Orbits: There is mild right sphenoid and right ethmoid sinus disease. Other: None. IMPRESSION: No acute intracranial process. Electronically Signed   By: Romona Curls M.D.   On: 07/24/2022 15:28    Procedures Procedures    Medications Ordered in ED Medications - No data to display  ED Course/ Medical Decision Making/ A&P                           Medical Decision Making Amount and/or Complexity of Data Reviewed Labs: ordered. Radiology: ordered.  Risk Prescription drug management.   Kevin Willis is here with dizziness.  Normal vitals.  No fever.  Normal neurological exam.  History of vertigo.  No other significant comorbidities.  He is ambulatory in the room without any issues.  He has been having some dizziness when he changes his  head movement.  Has nausea when this occurs.  Differential diagnosis is likely peripheral vertigo.  He has no signs to suggest stroke.  He is already had a CBC, BMP, head CT while in triage.  EKG per my review and interpretation shows sinus rhythm.  No ischemic changes.  Head CT is unremarkable.  There is no significant anemia, electrolyte abnormality, kidney injury.  Overall he is minimally symptomatic at this time.  Will refill his meclizine.  Provide him with Zofran prescription.  Have educated him about the Epley maneuver.  Will refer him to ENT if needed.  Discharged in good condition.  This chart was dictated using voice recognition software.  Despite best efforts to proofread,  errors can occur which can change the documentation meaning.         Final Clinical Impression(s) / ED Diagnoses Final diagnoses:  None    Rx / DC Orders ED Discharge Orders     None         Virgina Norfolk, DO 07/24/22 1601

## 2022-07-24 NOTE — ED Notes (Signed)
Patient is being discharged from the Urgent Care and sent to the Emergency Department via personal vehicle . Per Provider Erma Pinto, patient is in need of higher level of care due to needing further evaluation fo dizziness. Patient is aware and verbalizes understanding of plan of care.   Vitals:   07/24/22 1302  BP: 135/89  Pulse: 86  Resp: 18  Temp: 98.7 F (37.1 C)  SpO2: 97%

## 2023-01-08 ENCOUNTER — Emergency Department (HOSPITAL_COMMUNITY): Payer: 59

## 2023-01-08 ENCOUNTER — Emergency Department (HOSPITAL_COMMUNITY)
Admission: EM | Admit: 2023-01-08 | Discharge: 2023-01-08 | Disposition: A | Discharge status: Home / Self Care | Payer: 59 | Attending: Emergency Medicine | Admitting: Emergency Medicine

## 2023-01-08 ENCOUNTER — Ambulatory Visit
Admission: EM | Admit: 2023-01-08 | Discharge: 2023-01-08 | Disposition: A | Discharge status: Home / Self Care | Payer: 59 | Attending: Physician Assistant | Admitting: Physician Assistant

## 2023-01-08 ENCOUNTER — Encounter (HOSPITAL_COMMUNITY): Payer: Self-pay

## 2023-01-08 ENCOUNTER — Other Ambulatory Visit: Payer: Self-pay

## 2023-01-08 ENCOUNTER — Encounter: Payer: Self-pay | Admitting: Emergency Medicine

## 2023-01-08 DIAGNOSIS — R079 Chest pain, unspecified: Secondary | ICD-10-CM | POA: Diagnosis present

## 2023-01-08 DIAGNOSIS — J984 Other disorders of lung: Secondary | ICD-10-CM | POA: Insufficient documentation

## 2023-01-08 DIAGNOSIS — R0789 Other chest pain: Secondary | ICD-10-CM | POA: Insufficient documentation

## 2023-01-08 DIAGNOSIS — R000 Tachycardia, unspecified: Secondary | ICD-10-CM | POA: Insufficient documentation

## 2023-01-08 DIAGNOSIS — R42 Dizziness and giddiness: Secondary | ICD-10-CM

## 2023-01-08 LAB — CBC WITH DIFFERENTIAL/PLATELET
Abs Immature Granulocytes: 0.03 10*3/uL (ref 0.00–0.07)
Basophils Absolute: 0.1 10*3/uL (ref 0.0–0.1)
Basophils Relative: 1 %
Eosinophils Absolute: 0.3 10*3/uL (ref 0.0–0.5)
Eosinophils Relative: 3 %
HCT: 39.1 % (ref 39.0–52.0)
Hemoglobin: 13 g/dL (ref 13.0–17.0)
Immature Granulocytes: 0 %
Lymphocytes Relative: 37 %
Lymphs Abs: 3.8 10*3/uL (ref 0.7–4.0)
MCH: 28.6 pg (ref 26.0–34.0)
MCHC: 33.2 g/dL (ref 30.0–36.0)
MCV: 85.9 fL (ref 80.0–100.0)
Monocytes Absolute: 0.7 10*3/uL (ref 0.1–1.0)
Monocytes Relative: 7 %
Neutro Abs: 5.2 10*3/uL (ref 1.7–7.7)
Neutrophils Relative %: 52 %
Platelets: 410 10*3/uL — ABNORMAL HIGH (ref 150–400)
RBC: 4.55 MIL/uL (ref 4.22–5.81)
RDW: 12.4 % (ref 11.5–15.5)
WBC: 10.1 10*3/uL (ref 4.0–10.5)
nRBC: 0 % (ref 0.0–0.2)

## 2023-01-08 LAB — BASIC METABOLIC PANEL
Anion gap: 10 (ref 5–15)
BUN: 13 mg/dL (ref 6–20)
CO2: 25 mmol/L (ref 22–32)
Calcium: 8.9 mg/dL (ref 8.9–10.3)
Chloride: 103 mmol/L (ref 98–111)
Creatinine, Ser: 1.15 mg/dL (ref 0.61–1.24)
GFR, Estimated: >60 mL/min (ref >60)
Glucose, Bld: 113 mg/dL — ABNORMAL HIGH (ref 70–99)
Potassium: 3.6 mmol/L (ref 3.5–5.1)
Sodium: 138 mmol/L (ref 135–145)

## 2023-01-08 LAB — TROPONIN I (HIGH SENSITIVITY): Troponin I (High Sensitivity): <2 ng/L (ref <18)

## 2023-01-08 MED ORDER — IBUPROFEN 800 MG PO TABS: 800.0000 mg | ORAL_TABLET | Freq: Three times a day (TID) | ORAL | 0 refills | Status: AC

## 2023-01-08 MED ORDER — CYCLOBENZAPRINE HCL 10 MG PO TABS
5.0000 mg | ORAL_TABLET | Freq: Once | ORAL | Status: AC
  Administered 2023-01-08: 5 mg via ORAL
  Filled 2023-01-08: qty 1

## 2023-01-08 MED ORDER — IOHEXOL 350 MG/ML SOLN
100.0000 mL | Freq: Once | INTRAVENOUS | Status: AC | PRN
  Administered 2023-01-08: 100 mL via INTRAVENOUS

## 2023-01-08 MED ORDER — KETOROLAC TROMETHAMINE 30 MG/ML IJ SOLN: 30.0000 mg | Freq: Once | INTRAMUSCULAR | Status: DC

## 2023-01-08 MED ORDER — SODIUM CHLORIDE 0.9 % IV BOLUS
1000.0000 mL | Freq: Once | INTRAVENOUS | Status: AC
  Administered 2023-01-08: 1000 mL via INTRAVENOUS

## 2023-01-08 MED ORDER — CYCLOBENZAPRINE HCL 5 MG PO TABS: 5.0000 mg | ORAL_TABLET | Freq: Three times a day (TID) | ORAL | 0 refills | Status: AC | PRN

## 2023-01-08 MED ORDER — MORPHINE SULFATE (PF) 4 MG/ML IV SOLN
4.0000 mg | Freq: Once | INTRAVENOUS | Status: AC
  Administered 2023-01-08: 4 mg via INTRAVENOUS
  Filled 2023-01-08: qty 1

## 2023-01-08 NOTE — ED Provider Notes (Signed)
[place] [facility] Provider Note   CSN: [id number] Arrival date & time: 01/08/23  2002     History  Chief Complaint  Patient presents with   Tachycardia    Kevin Willis is a 27 y.o. male here with palpitations, chest pain.  Patient states that he has been having left-sided chest pain that radiates to the back for several days.  Patient states that it is worse with movement.  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  He went to urgent care was noted to be tachycardic and was sent here for further evaluation.  No history of blood clots.  Patient does have family history of CAD.  The history is provided by the patient.       Home Medications Prior to Admission medications   Medication Sig Start Date End Date Taking? Authorizing Provider  benzonatate (TESSALON) 100 MG capsule Take 1 capsule (100 mg total) by mouth every 8 (eight) hours as needed for cough. 02/27/22   Teodora Medici, FNP  colchicine 0.6 MG tablet Take by mouth. 12/02/20   [provider]  cyclobenzaprine (FLEXERIL) 10 MG tablet Take 1 tablet (10 mg total) by mouth 2 (two) times daily as needed for muscle spasms. 11/24/21   Francene Finders, PA-C  fluticasone (FLONASE) 50 MCG/ACT nasal spray Place 1 spray into both nostrils daily for 3 days. 02/27/22 03/02/22  Teodora Medici, FNP  meclizine (ANTIVERT) 25 MG tablet Take 1 tablet (25 mg total) by mouth 3 (three) times daily as needed for dizziness. 07/24/22   Curatolo, Adam, DO  naproxen (NAPROSYN) 500 MG tablet Take 1 tablet (500 mg total) by mouth 2 (two) times daily with a meal. 09/10/21   Jaynee Eagles, PA-C  ondansetron (ZOFRAN) 4 MG tablet Take 1 tablet (4 mg total) by mouth every 6 (six) hours. 07/24/22   Curatolo, Adam, DO      Allergies    Shellfish allergy    Review of Systems   Review of Systems  Cardiovascular:  Positive for chest pain.  All other systems reviewed and are  negative.   Physical Exam Updated Vital Signs BP (!) 151/95   Pulse 91   Temp 97.8 F (36.6 C) (Oral)   Resp 19   Ht 6\' 2"  (1.88 m)   Wt (!) 137.4 kg   SpO2 98%   BMI 38.90 kg/m  Physical Exam Vitals and nursing note reviewed.  HENT:     Nose: Nose normal.     Mouth/Throat:     Mouth: Mucous membranes are moist.  Eyes:     Extraocular Movements: Extraocular movements intact.     Pupils: Pupils are equal, round, and reactive to light.  Cardiovascular:     Rate and Rhythm: Normal rate and regular rhythm.     Pulses: Normal pulses.     Heart sounds: Normal heart sounds.  Pulmonary:     Effort: Pulmonary effort is normal.     Breath sounds: Normal breath sounds.     Comments: Reproducible left chest wall tenderness Abdominal:     General: Abdomen is flat.     Palpations: Abdomen is soft.  Musculoskeletal:        General: Normal range of motion.     Cervical back: Normal range of motion and neck supple.  Skin:    General: Skin is warm.     Capillary Refill: Capillary refill takes less than 2 seconds.  Neurological:     General: No focal deficit present.     Mental Status: He is alert and oriented to person, place, and time.  Psychiatric:        Mood and Affect: Mood normal.        Behavior: Behavior normal.     ED Results / Procedures / Treatments   Labs (all labs ordered are listed, but only abnormal results are displayed) Labs Reviewed  CBC WITH DIFFERENTIAL/PLATELET - Abnormal; Notable for the following components:      Result Value   Platelets 410 (*)    All other components within normal limits  BASIC METABOLIC PANEL  TROPONIN I (HIGH SENSITIVITY)    EKG EKG Interpretation  Date/Time:  Thursday January 08 2023 20:11:12 EST Ventricular Rate:  101 PR Interval:  133 QRS Duration: 89 QT Interval:  326 QTC Calculation: 423 R Axis:   43 Text Interpretation: Sinus tachycardia No significant change since last tracing Confirmed by Wandra Arthurs [phone]) on  01/08/2023 9:06:49 PM  Radiology DG Chest 2 View  Result Date: 01/08/2023 CLINICAL DATA:  Chest pain EXAM: CHEST - 2 VIEW COMPARISON:  None Available. FINDINGS: The heart size and mediastinal contours are within normal limits. Both lungs are clear. The visualized skeletal structures are unremarkable. IMPRESSION: No active cardiopulmonary disease. Electronically Signed   By: Donavan Foil M.D.   On: 01/08/2023 20:45    Procedures Procedures    Medications Ordered in ED Medications  sodium chloride 0.9 % bolus 1,000 mL (has no administration in time range)  morphine (PF) 4 MG/ML injection 4 mg (has no administration in time range)  cyclobenzaprine (FLEXERIL) tablet 5 mg (has no administration in time range)    ED Course/ Medical Decision Making/ A&P                             Medical Decision Making Migel Hannis is a 27 y.o. male here presenting with left-sided chest pain.  Pain is likely musculoskeletal.  However patient was tachycardic.  Moderate suspicion for PE.  Will get CTA chest.  Will also get CBC and BMP and troponin x 2.  10:46 PM Troponin negative.  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  I do not think that is causing her symptoms.  I think likely musculoskeletal in nature.  Encouraged him to take ibuprofen and also add Flexeril.  Stable for discharge  Problems Addressed: Bronchogenic cyst: acute illness or injury Chest wall pain: acute illness or injury  Amount and/or Complexity of Data Reviewed Labs: ordered. Decision-making details documented in ED Course. Radiology: ordered and independent interpretation performed. Decision-making details documented in ED Course. ECG/medicine tests: ordered and independent interpretation performed. Decision-making details documented in ED Course.  Risk Prescription drug management.    Final Clinical Impression(s) / ED Diagnoses Final diagnoses:  None    Rx / DC Orders ED Discharge Orders     None          Drenda Freeze, MD 01/08/23 2250

## 2023-01-08 NOTE — ED Notes (Signed)
Patient transported to CT 

## 2023-01-08 NOTE — ED Triage Notes (Signed)
States pain started in left neck and shoulder last week, was evaluated and given a steroid injection which mildly improved pain for a short period of time. Pain returned shortly after and has continued over the last week. Pain has since spread down his left arm and into his chest. States he has had to call out of work from the nausea and vomiting that has accompanied it. 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

## 2023-01-08 NOTE — ED Triage Notes (Signed)
Pt. Was seen at [facility] for tachycardia, and chest pain that radiates from the left shoulder to the center of his chest.

## 2023-01-08 NOTE — ED Triage Notes (Signed)
Pt. Now states it radiates from his central chest to his left shoulder

## 2023-01-08 NOTE — ED Provider Triage Note (Signed)
Emergency Medicine Provider Triage Evaluation Note  Kevin Willis , a 26 y.o. male  was evaluated in triage.  Pt complains of left-sided shoulder pain that radiates into his chest that has been going on for a few weeks.  He states in the last few days he also had violent episodes of vomiting and chest tightness.  Patient was seen at urgent care and was sent to ED for further workup.  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  Denies fever, shortness of breath.   Review of Systems  Positive: As above Negative: As above  Physical Exam  BP (!) 167/94 (BP Location: Left Wrist)   Pulse 95   Temp 97.8 F (36.6 C) (Oral)   Resp 17   Ht 6\' 2"  (1.88 m)   Wt (!) 137.4 kg   SpO2 98%   BMI 38.90 kg/m  Gen:   Awake, no distress   Resp:  Normal effort, does have chest and shoulder pain with deep breathing  MSK:   Moves extremities without difficulty  Other:    Medical Decision Making  Medically screening exam initiated at 8:27 PM.  Appropriate orders placed.  Markel Kurtenbach was informed that the remainder of the evaluation will be completed by another provider, this initial triage assessment does not replace that evaluation, and the importance of remaining in the ED until their evaluation is complete.     Kevin Willis, [place] 01/08/23 2029

## 2023-01-08 NOTE — ED Provider Notes (Signed)
[facility]    CSN: [id number] Arrival date & time: 01/08/23  1849      History   Chief Complaint Chief Complaint  Patient presents with   Shoulder Pain    HPI Kevin Willis is a 27 y.o. male.   Patient here today for evaluation of left sided shoulder pain that has now started radiating to his chest. He states he was given steroid injection into his joint which helped temporarily but this pain has continued over the last week and seems to be progressing. 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   The history is provided by the patient.  Shoulder Pain Associated symptoms: no fever     Past Medical History:  Diagnosis Date   Gout    Vertigo     There are no problems to display for this patient.   History reviewed. No pertinent surgical history.     Home Medications    Prior to Admission medications   Medication Sig Start Date End Date Taking? Authorizing Provider  benzonatate (TESSALON) 100 MG capsule Take 1 capsule (100 mg total) by mouth every 8 (eight) hours as needed for cough. 02/27/22   Teodora Medici, FNP  colchicine 0.6 MG tablet Take by mouth. 12/02/20   [provider]  cyclobenzaprine (FLEXERIL) 10 MG tablet Take 1 tablet (10 mg total) by mouth 2 (two) times daily as needed for muscle spasms. 11/24/21   Francene Finders, PA-C  fluticasone (FLONASE) 50 MCG/ACT nasal spray Place 1 spray into both nostrils daily for 3 days. 02/27/22 03/02/22  Teodora Medici, FNP  meclizine (ANTIVERT) 25 MG tablet Take 1 tablet (25 mg total) by mouth 3 (three) times daily as needed for dizziness. 07/24/22   Curatolo, Adam, DO  naproxen (NAPROSYN) 500 MG tablet Take 1 tablet (500 mg total) by mouth 2 (two) times daily with a meal. 09/10/21   Jaynee Eagles, PA-C  ondansetron (ZOFRAN) 4 MG tablet Take 1 tablet (4 mg total) by mouth every 6 (six) hours. 07/24/22   Lennice Sites,  DO    Family History Family History  Family history unknown: Yes    Social History Social History   Tobacco Use   Smoking status: Never   Smokeless tobacco: Never  Substance Use Topics   Alcohol use: Yes    Comment: occ   Drug use: Never     Allergies   Shellfish allergy   Review of Systems Review of Systems  Constitutional:  Negative for chills and fever.  HENT:  Negative for congestion and rhinorrhea.   Eyes:  Negative for discharge and redness.  Respiratory:  Negative for shortness of breath.   Cardiovascular:  Positive for chest pain.  Gastrointestinal:  Positive for nausea and vomiting.  Neurological:  Positive for light-headedness.     Physical Exam Triage Vital Signs ED Triage Vitals [01/08/23 1907]  Enc Vitals Group     BP 137/82     Pulse Rate (!) 107     Resp 16     Temp 97.9 F (36.6 C)     Temp Source Oral     SpO2 94 %     Weight      Height      Head Circumference      Peak Flow      Pain Score 6     Pain Loc      Pain  Edu?      Excl. in [place]?    No data found.  Updated Vital Signs BP 137/82 (BP Location: Left Arm)   Pulse (!) 107   Temp 97.9 F (36.6 C) (Oral)   Resp 16   SpO2 94%      Physical Exam Vitals and nursing note reviewed.  Constitutional:      General: He is not in acute distress.    Appearance: Normal appearance. He is not ill-appearing.  HENT:     Head: Normocephalic and atraumatic.  Eyes:     Conjunctiva/sclera: Conjunctivae normal.  Cardiovascular:     Rate and Rhythm: Regular rhythm. Tachycardia present.  Pulmonary:     Effort: Pulmonary effort is normal. No respiratory distress.     Breath sounds: Normal breath sounds. No wheezing, rhonchi or rales.  Neurological:     Mental Status: He is alert.  Psychiatric:        Mood and Affect: Mood normal.        Behavior: Behavior normal.      UC Treatments / Results  Labs (all labs ordered are listed, but only abnormal results are displayed) Labs  Reviewed - No data to display  EKG   Radiology No results found.  Procedures Procedures (including critical care time)  Medications Ordered in UC Medications - No data to display  Initial Impression / Assessment and Plan / UC Course  I have reviewed the triage vital signs and the nursing notes.  Pertinent labs & imaging results that were available during my care of the patient were reviewed by me and considered in my medical decision making (see chart for details).    EKG looks similar to prior however is noted to be tachycardic today. Recommended further evaluation in the ED to rule out cardiac etiology given nausea and lightheadedness, vs other cause of progressing symptoms. Patient is agreeable to same and feels safe to transport via POV.    Final Clinical Impressions(s) / UC Diagnoses   Final diagnoses:  Other chest pain  Lightheadedness   Discharge Instructions   None    ED Prescriptions   None    PDMP not reviewed this encounter.   Francene Finders, PA-C 01/08/23 1935

## 2023-01-08 NOTE — Discharge Instructions (Addendum)
Take motrin for pain and flexeril for muscle spasms   Rest for several days   See your doctor for follow up   See cardiology if you have worse chest pain for stress test.   Return to ER if you have worse chest pain, shortness of breath.

## 2023-03-09 ENCOUNTER — Ambulatory Visit
Admission: RE | Admit: 2023-03-09 | Discharge: 2023-03-09 | Disposition: A | Discharge status: Home / Self Care | Payer: 59 | Source: Ambulatory Visit | Attending: Dermatology | Admitting: Dermatology

## 2023-03-09 VITALS — BP 142/87 | HR 77 | Temp 98.0°F | Resp 18

## 2023-03-09 DIAGNOSIS — R609 Edema, unspecified: Secondary | ICD-10-CM | POA: Diagnosis not present

## 2023-03-09 NOTE — Discharge Instructions (Addendum)
Srop taking ibuprofen.  Elevate both feet.  Follow up at [facility] for complete evaluation Wear compression socks when you work

## 2023-03-09 NOTE — ED Provider Notes (Signed)
[facility]    CSN: [id number] Arrival date & time: 03/09/23  1452      History   Chief Complaint Chief Complaint  Patient presents with   Ankle Pain    Left ankle swelling hard to put pressure on - Entered by patient    HPI Kevin Willis is a 27 y.o. male.   Complains of swelling to both of his feet and ankles for the past week.  Patient has been taking 800 mg ibuprofen at home without relief of swelling.  History of gout but he has not seen any redness or swelling  The history is provided by the patient. No language interpreter was used.  Ankle Pain Pain details:    Quality:  Aching   Severity:  Moderate   Timing:  Constant   Progression:  Worsening Chronicity:  New Relieved by:  Nothing Worsened by:  Nothing Ineffective treatments:  None tried   Past Medical History:  Diagnosis Date   Gout    Vertigo     There are no problems to display for this patient.   History reviewed. No pertinent surgical history.     Home Medications    Prior to Admission medications   Medication Sig Start Date End Date Taking? Authorizing Provider  benzonatate (TESSALON) 100 MG capsule Take 1 capsule (100 mg total) by mouth every 8 (eight) hours as needed for cough. 02/27/22   Teodora Medici, FNP  colchicine 0.6 MG tablet Take by mouth. 12/02/20   [provider]  cyclobenzaprine (FLEXERIL) 5 MG tablet Take 1 tablet (5 mg total) by mouth 3 (three) times daily as needed for muscle spasms. 01/08/23   Drenda Freeze, MD  fluticasone (FLONASE) 50 MCG/ACT nasal spray Place 1 spray into both nostrils daily for 3 days. 02/27/22 03/02/22  Teodora Medici, FNP  ibuprofen (ADVIL) 800 MG tablet Take 1 tablet (800 mg total) by mouth 3 (three) times daily. 01/08/23   Drenda Freeze, MD  meclizine (ANTIVERT) 25 MG tablet Take 1 tablet (25 mg total) by mouth 3 (three) times daily as needed for dizziness. 07/24/22   Curatolo, Adam, DO  naproxen (NAPROSYN) 500 MG tablet Take  1 tablet (500 mg total) by mouth 2 (two) times daily with a meal. 09/10/21   Jaynee Eagles, PA-C  ondansetron (ZOFRAN) 4 MG tablet Take 1 tablet (4 mg total) by mouth every 6 (six) hours. 07/24/22   Lennice Sites, DO    Family History Family History  Family history unknown: Yes    Social History Social History   Tobacco Use   Smoking status: Never   Smokeless tobacco: Never  Substance Use Topics   Alcohol use: Yes    Comment: occ   Drug use: Never     Allergies   Shellfish allergy   Review of Systems Review of Systems  All other systems reviewed and are negative.    Physical Exam Triage Vital Signs ED Triage Vitals [03/09/23 1530]  Enc Vitals Group     BP (!) 142/87     Pulse Rate 77     Resp 18     Temp 98 F (36.7 C)     Temp Source Oral     SpO2 97 %     Weight      Height      Head Circumference      Peak Flow      Pain Score 4     Pain Loc  Pain Edu?      Excl. in [place]?    No data found.  Updated Vital Signs BP (!) 142/87 (BP Location: Right Arm)   Pulse 77   Temp 98 F (36.7 C) (Oral)   Resp 18   SpO2 97%   Visual Acuity Right Eye Distance:   Left Eye Distance:   Bilateral Distance:    Right Eye Near:   Left Eye Near:    Bilateral Near:     Physical Exam Vitals and nursing note reviewed.  Constitutional:      Appearance: He is well-developed.  HENT:     Head: Normocephalic.  Cardiovascular:     Rate and Rhythm: Normal rate.  Pulmonary:     Effort: Pulmonary effort is normal.  Abdominal:     General: There is no distension.  Musculoskeletal:        General: No swelling or tenderness. Normal range of motion.  Neurological:     Mental Status: He is alert and oriented to person, place, and time.  Psychiatric:        Mood and Affect: Mood normal.      UC Treatments / Results  Labs (all labs ordered are listed, but only abnormal results are displayed) Labs Reviewed - No data to display  EKG   Radiology No results  found.  Procedures Procedures (including critical care time)  Medications Ordered in UC Medications - No data to display  Initial Impression / Assessment and Plan / UC Course  I have reviewed the triage vital signs and the nursing notes.  Pertinent labs & imaging results that were available during my care of the patient were reviewed by me and considered in my medical decision making (see chart for details).      Final Clinical Impressions(s) / UC Diagnoses   Final diagnoses:  Dependent edema     Discharge Instructions      Srop taking ibuprofen.  Elevate both feet.  Follow up at [facility] for complete evaluation Wear compression socks when you work    ED Prescriptions   None    PDMP not reviewed this encounter.An After Visit Summary was printed and given to the patient.       Fransico Meadow, [place] 03/09/23 1626

## 2023-03-09 NOTE — ED Triage Notes (Signed)
Pt c/o recurrent episodes of ankles swelling. Mostly on the left but thinks after he compensates using the right, the right ankle begins to swell as well. Reports that this episode began ~ 7 days ago.

## 2023-03-17 ENCOUNTER — Ambulatory Visit
Admission: RE | Admit: 2023-03-17 | Discharge: 2023-03-17 | Disposition: A | Discharge status: Home / Self Care | Payer: 59 | Source: Ambulatory Visit | Attending: Physician Assistant | Admitting: Physician Assistant

## 2023-03-17 ENCOUNTER — Other Ambulatory Visit: Payer: Self-pay

## 2023-03-17 VITALS — BP 143/90 | HR 101 | Temp 98.8°F | Resp 18

## 2023-03-17 DIAGNOSIS — M109 Gout, unspecified: Secondary | ICD-10-CM | POA: Diagnosis not present

## 2023-03-17 DIAGNOSIS — M79671 Pain in right foot: Secondary | ICD-10-CM | POA: Diagnosis not present

## 2023-03-17 MED ORDER — NAPROXEN 500 MG PO TABS: 500.0000 mg | ORAL_TABLET | Freq: Two times a day (BID) | ORAL | 2 refills | Status: AC

## 2023-03-17 MED ORDER — PREDNISONE 10 MG PO TABS: 10.0000 mg | ORAL_TABLET | Freq: Three times a day (TID) | ORAL | 0 refills | Status: AC

## 2023-03-17 MED ORDER — COLCHICINE 0.6 MG PO TABS: ORAL_TABLET | ORAL | 2 refills | Status: AC

## 2023-03-17 NOTE — Discharge Instructions (Addendum)
Advised take the Naprosyn 500 mg every 12 hours to help reduce pain and discomfort. Advised take the Colchinine . 6mg  the main 2 tablets initially and then 1 tablet an hour later. Advised take prednisone 10 mg 3 times a day to help reduce pain and swelling.  Advised to follow-up PCP return to urgent care as needed.

## 2023-03-17 NOTE — ED Provider Notes (Signed)
[facility]    CSN: [id number] Arrival date & time: 03/17/23  1301      History   Chief Complaint Chief Complaint  Patient presents with   Foot Pain    I have gout my big toe on my right foot is swollen hurts to put any pressure on - Entered by patient    HPI [facility] is a 27 y.o. male.   27 year old male presents with right toe pain.  Patient indicates he has a history of having gout.  He relates that on Saturday he was having the flu with episodes of nausea and vomiting, diarrhea.  He believes that he became dehydrated.  Sunday is when he started experiencing a flare of the gout on the right big toe.  He indicates it has been progressing over the past couple days.  He has significant pain in the right big toe joint, pain with walking causing him to limp when he tries to walk.  He indicates he is not getting much relief with over-the-counter medicines.  He denies any trauma to the area.  He is without fever or chills.  He indicates he has not eaten any foods that he recalls would cause an episode of gout to flare.   Foot Pain    Past Medical History:  Diagnosis Date   Gout    Vertigo     There are no problems to display for this patient.   History reviewed. No pertinent surgical history.     Home Medications    Prior to Admission medications   Medication Sig Start Date End Date Taking? Authorizing Provider  colchicine 0.6 MG tablet Take 2 tablets initially and then take the last tablet 1 hour later. 03/17/23  Yes Nyoka Lint, PA-C  predniSONE (DELTASONE) 10 MG tablet Take 1 tablet (10 mg total) by mouth in the morning, at noon, and at bedtime. 03/17/23  Yes Nyoka Lint, PA-C  benzonatate (TESSALON) 100 MG capsule Take 1 capsule (100 mg total) by mouth every 8 (eight) hours as needed for cough. Patient not taking: Reported on 03/17/2023 02/27/22   Teodora Medici, FNP  colchicine 0.6 MG tablet Take by mouth. 12/02/20   [provider]   cyclobenzaprine (FLEXERIL) 5 MG tablet Take 1 tablet (5 mg total) by mouth 3 (three) times daily as needed for muscle spasms. 01/08/23   Drenda Freeze, MD  fluticasone (FLONASE) 50 MCG/ACT nasal spray Place 1 spray into both nostrils daily for 3 days. 02/27/22 03/02/22  Teodora Medici, FNP  ibuprofen (ADVIL) 800 MG tablet Take 1 tablet (800 mg total) by mouth 3 (three) times daily. 01/08/23   Drenda Freeze, MD  meclizine (ANTIVERT) 25 MG tablet Take 1 tablet (25 mg total) by mouth 3 (three) times daily as needed for dizziness. 07/24/22   Curatolo, Adam, DO  naproxen (NAPROSYN) 500 MG tablet Take 1 tablet (500 mg total) by mouth 2 (two) times daily with a meal. 03/17/23   Nyoka Lint, PA-C  ondansetron (ZOFRAN) 4 MG tablet Take 1 tablet (4 mg total) by mouth every 6 (six) hours. 07/24/22   Lennice Sites, DO    Family History Family History  Family history unknown: Yes    Social History Social History   Tobacco Use   Smoking status: Never   Smokeless tobacco: Never  Substance Use Topics   Alcohol use: Yes    Comment: occ   Drug use: Never     Allergies   Shellfish allergy  Review of Systems Review of Systems  Musculoskeletal:  Positive for joint swelling (right toe joint big toe).     Physical Exam Triage Vital Signs ED Triage Vitals  Enc Vitals Group     BP 03/17/23 1337 (!) 143/90     Pulse Rate 03/17/23 1337 (!) 101     Resp 03/17/23 1337 18     Temp 03/17/23 1337 98.8 F (37.1 C)     Temp Source 03/17/23 1337 Oral     SpO2 03/17/23 1337 95 %     Weight --      Height --      Head Circumference --      Peak Flow --      Pain Score 03/17/23 1338 8     Pain Loc --      Pain Edu? --      Excl. in [place]? --    No data found.  Updated Vital Signs BP (!) 143/90 (BP Location: Left Arm)   Pulse (!) 101   Temp 98.8 F (37.1 C) (Oral)   Resp 18   SpO2 95%   Visual Acuity Right Eye Distance:   Left Eye Distance:   Bilateral Distance:    Right Eye  Near:   Left Eye Near:    Bilateral Near:     Physical Exam Constitutional:      Appearance: Normal appearance.  Musculoskeletal:       Feet:  Feet:     Comments: Right foot: Pain is palpated along the first distal MTP joint area with mild swelling minimal redness at the site.  There is decreased range of motion due to pain and discomfort.  The rest of the foot dorsum and plantar surface along with the toes are normal, function is normal. Neurological:     Mental Status: He is alert.      UC Treatments / Results  Labs (all labs ordered are listed, but only abnormal results are displayed) Labs Reviewed - No data to display  EKG   Radiology No results found.  Procedures Procedures (including critical care time)  Medications Ordered in UC Medications - No data to display  Initial Impression / Assessment and Plan / UC Course  I have reviewed the triage vital signs and the nursing notes.  Pertinent labs & imaging results that were available during my care of the patient were reviewed by me and considered in my medical decision making (see chart for details).    Plan: The diagnosis to be treated with the following: 1.  Acute gout right foot: A.  Naprosyn 500 mg every 12 hours to treat the pain and discomfort. B.  Colchinine .6 mg, 2 initially and then 1 an hour later. C.  Prednisone 10 mg 3 times a day for 5 days only. 2.  Right foot pain: A.  Naprosyn 500 mg every 12 hours to treat the pain and discomfort. 3.  Advised follow-up PCP return to urgent care as needed.  Final Clinical Impressions(s) / UC Diagnoses   Final diagnoses:  Right foot pain  Acute gout involving toe of right foot, unspecified cause     Discharge Instructions      Advised take the Naprosyn 500 mg every 12 hours to help reduce pain and discomfort. Advised take the Colchinine . 6mg  the main 2 tablets initially and then 1 tablet an hour later. Advised take prednisone 10 mg 3 times a day to  help reduce pain and swelling.  Advised to follow-up  PCP return to urgent care as needed.    ED Prescriptions     Medication Sig Dispense Auth. Provider   naproxen (NAPROSYN) 500 MG tablet Take 1 tablet (500 mg total) by mouth 2 (two) times daily with a meal. 30 tablet Nyoka Lint, PA-C   colchicine 0.6 MG tablet Take 2 tablets initially and then take the last tablet 1 hour later. 3 tablet Nyoka Lint, PA-C   predniSONE (DELTASONE) 10 MG tablet Take 1 tablet (10 mg total) by mouth in the morning, at noon, and at bedtime. 15 tablet Nyoka Lint, PA-C      PDMP not reviewed this encounter.   Cheick, Vert, PA-C 03/17/23 1352

## 2023-03-17 NOTE — ED Triage Notes (Signed)
Pt here for right foot pain from gout with hx of same; pt sts pain x 3 days

## 2023-12-05 IMAGING — DX DG WRIST COMPLETE 3+V*L*
4 series · 4 of 4 positions shown · non-contrast
Comparison: None.

CLINICAL DATA: left wrist pain

EXAM:
LEFT WRIST - COMPLETE 3+ VIEW

[wrist pa (1 of 3)]
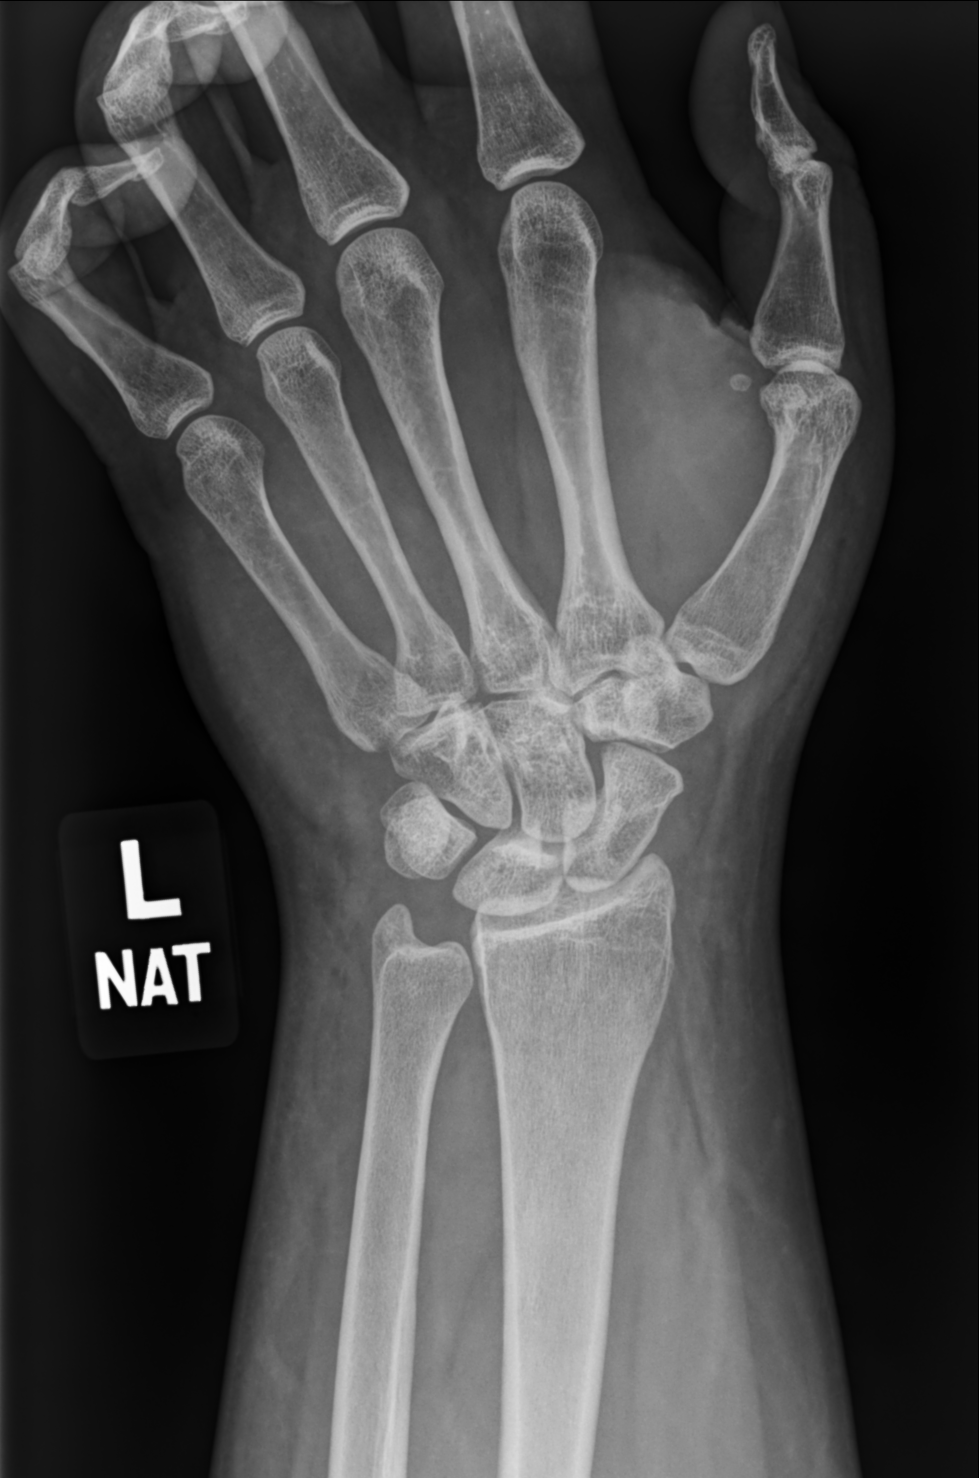

[wrist pa (2 of 3)]
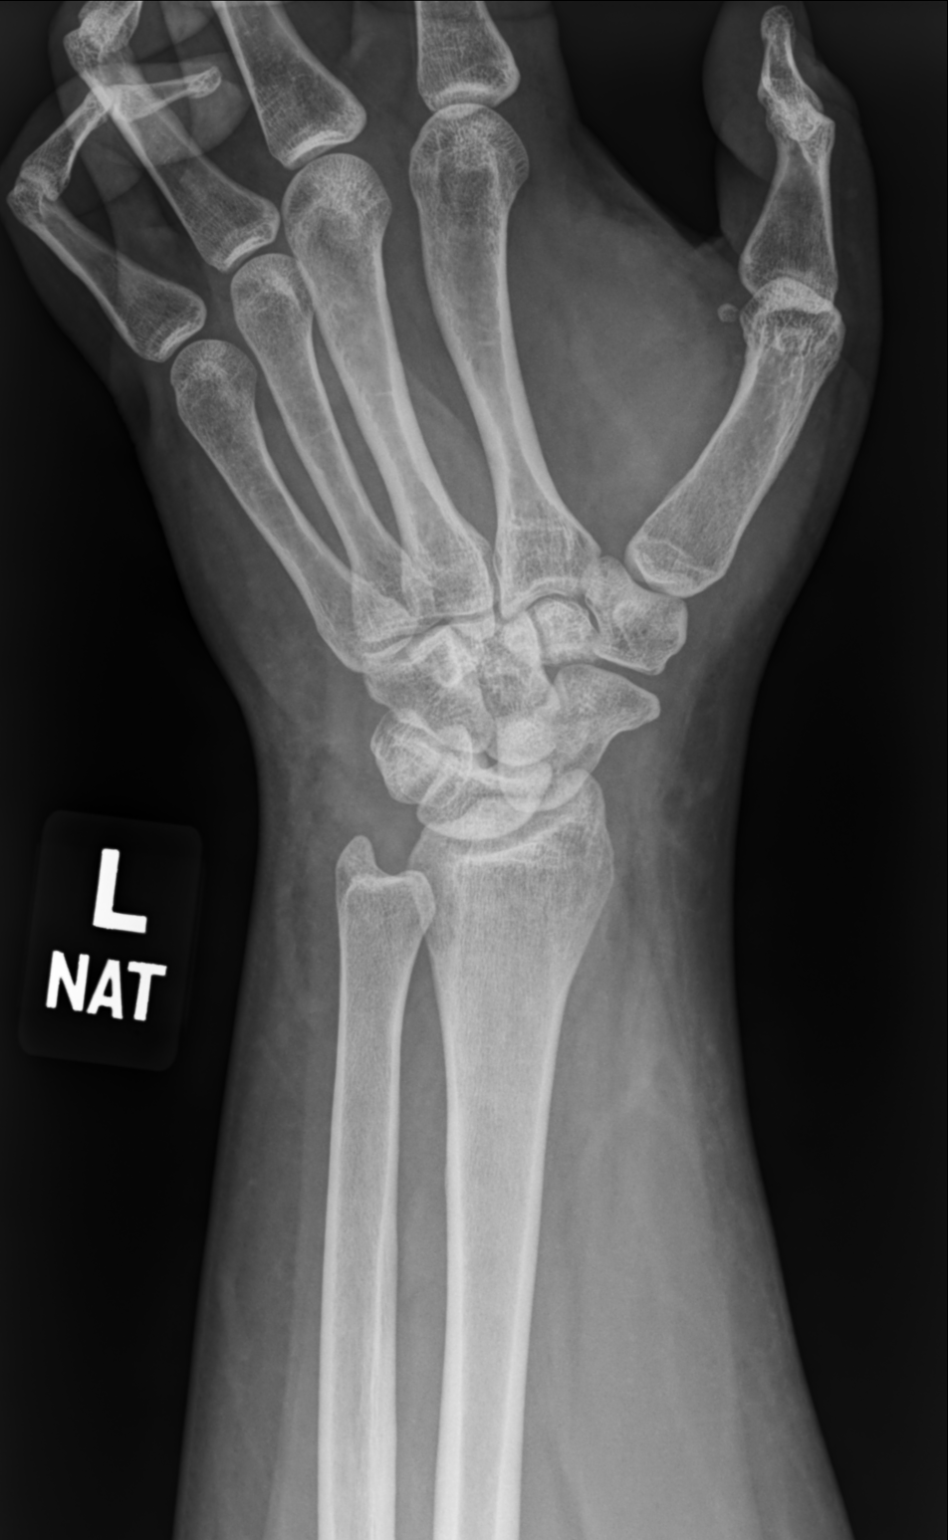

[wrist lat]
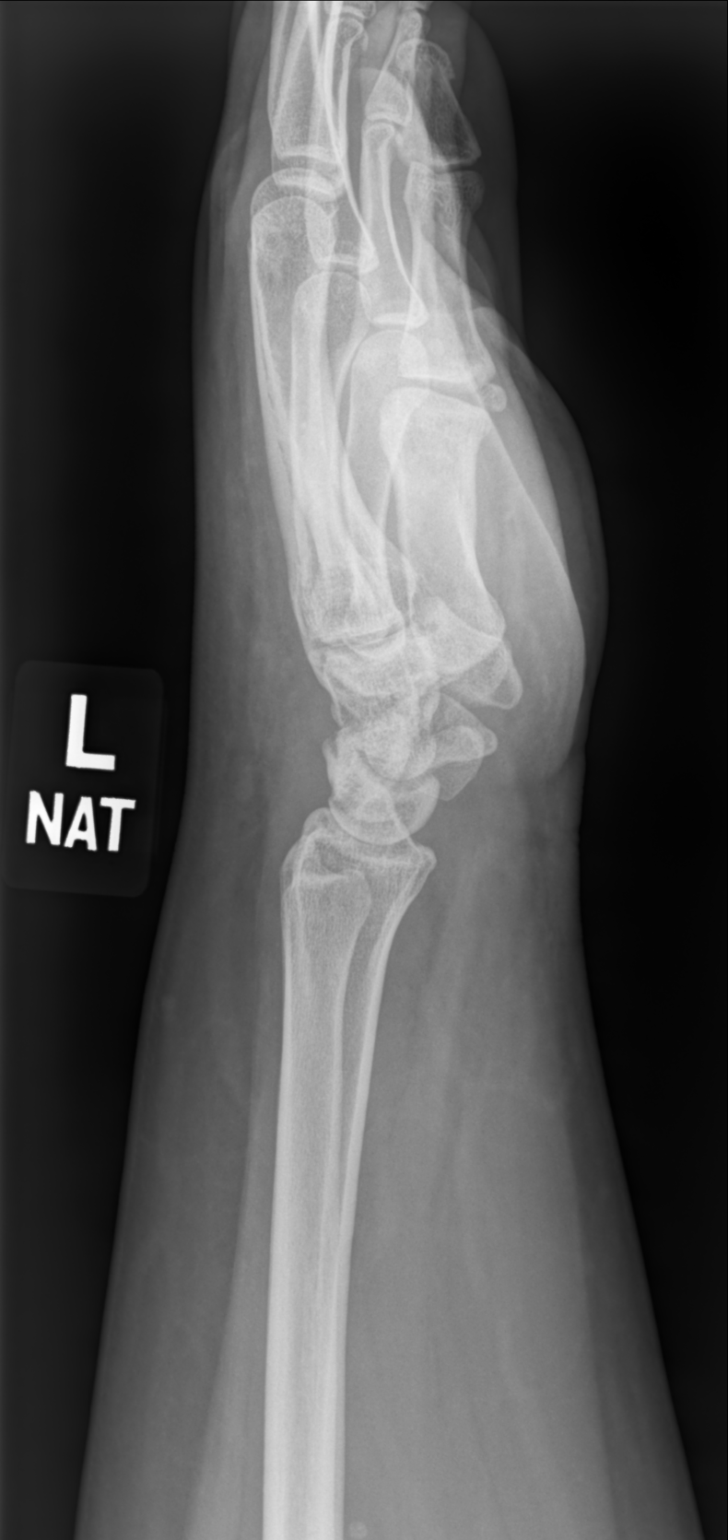

[wrist pa (3 of 3)]
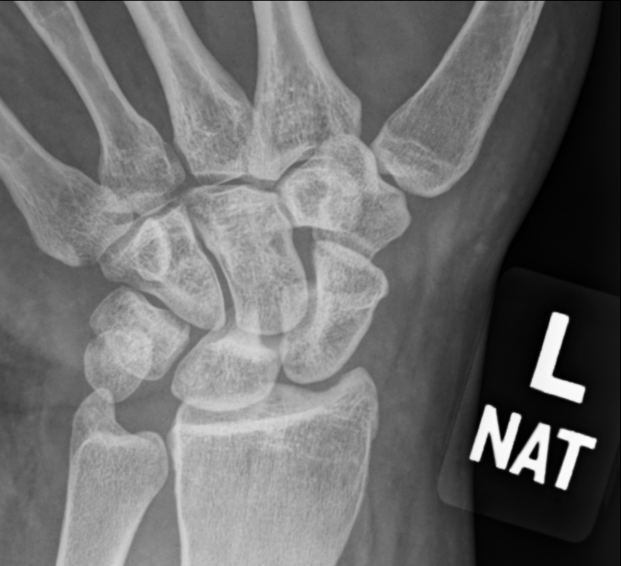

[4 of 4 positions shown; findings below may reference images not displayed]

FINDINGS: There is no evidence of fracture or dislocation. There is no
evidence of arthropathy or other focal bone abnormality. Soft
tissues are unremarkable.
IMPRESSION: Negative.
